# Patient Record
Sex: Male | Born: 1955 | Race: White | Hispanic: No | Marital: Married | Smoking: Never smoker
Health system: Southern US, Community
[De-identification: ages and names within clinical notes are randomized; demographics above are authoritative.]

## PROBLEM LIST (undated history)

## (undated) DIAGNOSIS — M549 Dorsalgia, unspecified: Secondary | ICD-10-CM

## (undated) DIAGNOSIS — I4891 Unspecified atrial fibrillation: Secondary | ICD-10-CM

## (undated) DIAGNOSIS — I10 Essential (primary) hypertension: Secondary | ICD-10-CM

## (undated) DIAGNOSIS — E663 Overweight: Secondary | ICD-10-CM

## (undated) DIAGNOSIS — I480 Paroxysmal atrial fibrillation: Secondary | ICD-10-CM

## (undated) DIAGNOSIS — K219 Gastro-esophageal reflux disease without esophagitis: Secondary | ICD-10-CM

## (undated) DIAGNOSIS — G4733 Obstructive sleep apnea (adult) (pediatric): Secondary | ICD-10-CM

## (undated) HISTORY — DX: Unspecified atrial fibrillation: I48.91

## (undated) HISTORY — DX: Obstructive sleep apnea (adult) (pediatric): G47.33

## (undated) HISTORY — DX: Paroxysmal atrial fibrillation: I48.0

## (undated) HISTORY — DX: Overweight: E66.3

## (undated) HISTORY — PX: OTHER SURGICAL HISTORY: SHX169

## (undated) HISTORY — DX: Dorsalgia, unspecified: M54.9

## (undated) HISTORY — DX: Gastro-esophageal reflux disease without esophagitis: K21.9

## (undated) HISTORY — DX: Essential (primary) hypertension: I10

---

## 2001-06-16 ENCOUNTER — Ambulatory Visit (HOSPITAL_BASED_OUTPATIENT_CLINIC_OR_DEPARTMENT_OTHER): Admission: RE | Admit: 2001-06-16 | Discharge: 2001-06-16 | Payer: Self-pay | Admitting: *Deleted

## 2011-01-21 ENCOUNTER — Ambulatory Visit (HOSPITAL_COMMUNITY)
Admission: RE | Admit: 2011-01-21 | Discharge: 2011-01-21 | Disposition: A | Discharge status: Home / Self Care | Payer: BC Managed Care – PPO | Source: Ambulatory Visit | Attending: Obstetrics and Gynecology | Admitting: Obstetrics and Gynecology

## 2013-11-07 ENCOUNTER — Encounter: Payer: Self-pay | Admitting: *Deleted

## 2013-11-07 ENCOUNTER — Encounter: Payer: Self-pay | Admitting: Interventional Cardiology

## 2013-11-07 DIAGNOSIS — I4891 Unspecified atrial fibrillation: Secondary | ICD-10-CM | POA: Insufficient documentation

## 2013-11-07 DIAGNOSIS — I10 Essential (primary) hypertension: Secondary | ICD-10-CM | POA: Insufficient documentation

## 2013-11-07 DIAGNOSIS — K219 Gastro-esophageal reflux disease without esophagitis: Secondary | ICD-10-CM | POA: Insufficient documentation

## 2013-11-07 DIAGNOSIS — M549 Dorsalgia, unspecified: Secondary | ICD-10-CM | POA: Insufficient documentation

## 2013-11-08 ENCOUNTER — Ambulatory Visit (INDEPENDENT_AMBULATORY_CARE_PROVIDER_SITE_OTHER): Payer: BC Managed Care – PPO | Admitting: Interventional Cardiology

## 2013-11-08 ENCOUNTER — Encounter: Payer: Self-pay | Admitting: Interventional Cardiology

## 2013-11-08 ENCOUNTER — Ambulatory Visit: Payer: BC Managed Care – PPO | Admitting: Interventional Cardiology

## 2013-11-08 VITALS — BP 128/78 | HR 81 | Ht 70.0 in | Wt 207.0 lb

## 2013-11-08 DIAGNOSIS — I10 Essential (primary) hypertension: Secondary | ICD-10-CM

## 2013-11-08 DIAGNOSIS — I480 Paroxysmal atrial fibrillation: Secondary | ICD-10-CM

## 2013-11-08 DIAGNOSIS — I4891 Unspecified atrial fibrillation: Secondary | ICD-10-CM

## 2013-11-08 NOTE — Progress Notes (Signed)
Patient ID: Miguel Stevens, male   DOB: [birth year], 57 y.o.   MRN: [id number]    [address], [address] [place], [place]  [address] Phone: [phone] Fax:  [phone]  Date:  11/08/2013   ID:  Miguel Stevens, DOB Aug 25, 1956, MRN [id number]  PCP:  No primary provider on file.   ASSESSMENT:  1. Paroxysmal atrial fibrillation 2. Hypertension  PLAN:  1. Continue aspirin 81 mg daily 2. If increased palpitations, may increase metoprolol intermittently to 75 mg per day instead of the usual 50 (in other words it would be okay to take an additional 25 mg if he is having particular difficulty with palpitations) 3. One-year followup   SUBJECTIVE: Miguel Stevens is a 57 y.o. male who is doing well and expecting a third child. He has occasional palpitations but no prolonged severe episodes. He still endorses a dramatic improvement in palpitations, reduction in headaches, and decreased episodes of flushing since starting metoprolol. No side effects. Her erectile dysfunction. Overall he is doing quite well. He is transferred with him several times per week.   Wt Readings from Last 3 Encounters:  11/08/13 207 lb (93.895 kg)     Past Medical History  Diagnosis Date  . Atrial fibrillation     Lone atrial fibrillation by Holter July 2009. This echo demonstrates normal left ventricle size and function. LA size 42 mm   . Hypertension   . Back pain   . GERD (gastroesophageal reflux disease)     Current Outpatient Prescriptions  Medication Sig Dispense Refill  . aspirin 81 MG tablet Take 81 mg by mouth daily.      . metoprolol succinate (TOPROL-XL) 50 MG 24 hr tablet Take 1 tab daily      . Multiple Vitamin (MULTIVITAMIN) tablet Take 1 tablet by mouth daily.      . Omega-3 Fatty Acids (FISH OIL) 1000 MG CAPS Take by mouth.      . ranitidine (ZANTAC) 150 MG capsule Take 150 mg by mouth 2 (two) times daily.       No current facility-administered medications for this visit.     Allergies:   No Known Allergies  Social History:  The patient  reports that he has never smoked. He does not have any smokeless tobacco history on file. He reports that he drinks alcohol. He reports that he does not use illicit drugs.   ROS:  Please see the history of present illness.   Denies syncope, transient neurological symptoms, edema, and dyspnea   All other systems reviewed and negative.   OBJECTIVE: VS:  BP 128/78  Pulse 81  Ht 5\' 10"  (1.778 m)  Wt 207 lb (93.895 kg)  BMI 29.70 kg/m2 Well nourished, well developed, in no acute distress, younger than stated age, bearded, young. HEENT: normal Neck: JVD flat. Carotid bruit 2+ upstroke without bruit  Cardiac:  normal S1, S2; RRR; no murmur Lungs:  clear to auscultation bilaterally, no wheezing, rhonchi or rales Abd: soft, nontender, no hepatomegaly Ext: Edema absent. Pulses 2+ Skin: warm and dry Neuro:  CNs 2-12 intact, no focal abnormalities noted  EKG:  Normal       Signed, Darci Needle III, MD 11/08/2013 3:25 PM

## 2013-11-08 NOTE — Patient Instructions (Signed)
Your physician recommends that you continue on your current medications as directed. Please refer to the Current Medication list given to you today.  Ok to take an extra 1/2 tablet (25mg ) if you are experiencing palpitations  Your physician wants you to follow-up in: 1 year You will receive a reminder letter in the mail two months in advance. If you don't receive a letter, please call our office to schedule the follow-up appointment.  Your physician discussed the importance of regular exercise and recommended that you start or continue a regular exercise program for good health.

## 2014-04-08 ENCOUNTER — Other Ambulatory Visit: Payer: Self-pay | Admitting: Interventional Cardiology

## 2014-10-08 ENCOUNTER — Other Ambulatory Visit: Payer: Self-pay | Admitting: Interventional Cardiology

## 2015-01-04 ENCOUNTER — Other Ambulatory Visit: Payer: Self-pay | Admitting: *Deleted

## 2015-01-04 MED ORDER — METOPROLOL SUCCINATE ER 50 MG PO TB24: 50.0000 mg | ORAL_TABLET | Freq: Every day | ORAL | Status: DC

## 2015-02-07 ENCOUNTER — Other Ambulatory Visit: Payer: Self-pay | Admitting: Interventional Cardiology

## 2015-02-08 ENCOUNTER — Other Ambulatory Visit: Payer: Self-pay

## 2015-02-08 MED ORDER — METOPROLOL SUCCINATE ER 50 MG PO TB24: 50.0000 mg | ORAL_TABLET | Freq: Every day | ORAL | Status: DC

## 2015-02-11 ENCOUNTER — Other Ambulatory Visit: Payer: Self-pay | Admitting: *Deleted

## 2015-02-11 MED ORDER — METOPROLOL SUCCINATE ER 50 MG PO TB24: 50.0000 mg | ORAL_TABLET | Freq: Every day | ORAL | Status: DC

## 2015-02-12 ENCOUNTER — Encounter: Payer: Self-pay | Admitting: Interventional Cardiology

## 2015-02-12 ENCOUNTER — Ambulatory Visit (INDEPENDENT_AMBULATORY_CARE_PROVIDER_SITE_OTHER): Payer: BC Managed Care – PPO | Admitting: Interventional Cardiology

## 2015-02-12 VITALS — BP 144/88 | HR 54 | Ht 70.0 in | Wt 213.0 lb

## 2015-02-12 DIAGNOSIS — I10 Essential (primary) hypertension: Secondary | ICD-10-CM

## 2015-02-12 DIAGNOSIS — E785 Hyperlipidemia, unspecified: Secondary | ICD-10-CM

## 2015-02-12 DIAGNOSIS — I480 Paroxysmal atrial fibrillation: Secondary | ICD-10-CM

## 2015-02-12 NOTE — Progress Notes (Signed)
Cardiology Office Note   Date:  02/12/2015   ID:  Miguel Bucklerndrew C Fallin, DOB [birth year], MRN [id number]  PCP:  No primary care provider on file.  Cardiologist:   Lesleigh NoeSMITH III,Annamae Shivley W, MD   No chief complaint on file.     History of Present Illness: Miguel Stevens is a 59 y.o. male who presents for follow-up of palpitations. Also history of brief atrial fibrillation. His chads score is 1. He has had no prolonged palpitations. He denies chest pain. No history of syncope. No medication side effects. Occasional orthostatic dizziness.    Past Medical History  Diagnosis Date  . Atrial fibrillation     Lone atrial fibrillation by Holter July 2009. This echo demonstrates normal left ventricle size and function. LA size 42 mm   . Hypertension   . Back pain   . GERD (gastroesophageal reflux disease)     Past Surgical History  Procedure Laterality Date  . Wisdom teeth excised       Current Outpatient Prescriptions  Medication Sig Dispense Refill  . aspirin 81 MG tablet Take 81 mg by mouth daily.    Marland Kitchen. ibuprofen (ADVIL,MOTRIN) 200 MG tablet Take 200 mg by mouth every 4 (four) hours.    . metoprolol succinate (TOPROL-XL) 50 MG 24 hr tablet Take 1 tablet (50 mg total) by mouth daily. Take with or immediately following a meal. 30 tablet 11  . Multiple Vitamin (MULTIVITAMIN) tablet Take 1 tablet by mouth daily.    . Omega-3 Fatty Acids (FISH OIL) 1000 MG CAPS Take by mouth.    . ranitidine (ZANTAC) 150 MG capsule Take 150 mg by mouth 2 (two) times daily.     No current facility-administered medications for this visit.    Allergies:   Review of patient's allergies indicates no known allergies.    Social History:  The patient  reports that he has never smoked. He does not have any smokeless tobacco history on file. He reports that he drinks alcohol. He reports that he does not use illicit drugs.   Family History:  The patient's family history includes Heart disease in his father and mother.      ROS:  Please see the history of present illness.   Otherwise, review of systems are positive for none.   All other systems are reviewed and negative.    PHYSICAL EXAM: VS:  BP 144/88 mmHg  Pulse 54  Ht 5\' 10"  (1.778 m)  Wt 213 lb (96.616 kg)  BMI 30.56 kg/m2 , BMI Body mass index is 30.56 kg/(m^2). GEN: Well nourished, well developed, in no acute distress HEENT: normal Neck: no JVD, carotid bruits, or masses Cardiac:RRR; no murmurs, rubs, or gallops,no edema  Respiratory:  clear to auscultation bilaterally, normal work of breathing GI: soft, nontender, nondistended, + BS MS: no deformity or atrophy Skin: warm and dry, no rash Neuro:  Strength and sensation are intact Psych: euthymic mood, full affect   EKG:  EKG is ordered today. The ekg ordered today demonstrates sinus bradycardia and otherwise normal   Recent Labs: No results found for requested labs within last 365 days.    Lipid Panel No results found for: CHOL, TRIG, HDL, CHOLHDL, VLDL, LDLCALC, LDLDIRECT    Wt Readings from Last 3 Encounters:  02/12/15 213 lb (96.616 kg)  11/08/13 207 lb (93.895 kg)      Other studies Reviewed: Additional studies/ records that were reviewed today include: .   ASSESSMENT AND PLAN:  1.  Hypertension,  stable. 2. History of atrial fibrillation with no recent recurrences   Current medicines are reviewed at length with the patient today.  The patient does not have concerns regarding medicines.  The following changes have been made:  Refill indications for 90 day supplies  Labs/ tests ordered today include:  No orders of the defined types were placed in this encounter.     Disposition:   FU with Verdis Prime in one Year   Signed, Lesleigh Noe, MD  02/12/2015 8:56 AM    [facility] [address] [place], [place], [place]  [address] Phone: [phone]; Fax: [phone]

## 2015-02-12 NOTE — Patient Instructions (Signed)
Your physician recommends that you continue on your current medications as directed. Please refer to the Current Medication list given to you today.  Your physician wants you to follow-up in: 1 year with Dr.Smith You will receive a reminder letter in the mail two months in advance. If you don't receive a letter, please call our office to schedule the follow-up appointment.  

## 2015-04-09 ENCOUNTER — Other Ambulatory Visit: Payer: Self-pay | Admitting: Interventional Cardiology

## 2015-04-10 ENCOUNTER — Other Ambulatory Visit: Payer: Self-pay | Admitting: Interventional Cardiology

## 2015-12-31 ENCOUNTER — Other Ambulatory Visit: Payer: Self-pay | Admitting: Interventional Cardiology

## 2016-03-12 ENCOUNTER — Ambulatory Visit (INDEPENDENT_AMBULATORY_CARE_PROVIDER_SITE_OTHER): Payer: BC Managed Care – PPO | Admitting: Interventional Cardiology

## 2016-03-12 ENCOUNTER — Encounter: Payer: Self-pay | Admitting: Interventional Cardiology

## 2016-03-12 VITALS — BP 120/86 | HR 51 | Ht 70.0 in | Wt 205.0 lb

## 2016-03-12 DIAGNOSIS — I10 Essential (primary) hypertension: Secondary | ICD-10-CM

## 2016-03-12 DIAGNOSIS — E785 Hyperlipidemia, unspecified: Secondary | ICD-10-CM | POA: Diagnosis not present

## 2016-03-12 DIAGNOSIS — I480 Paroxysmal atrial fibrillation: Secondary | ICD-10-CM

## 2016-03-12 MED ORDER — METOPROLOL SUCCINATE ER 50 MG PO TB24: 50.0000 mg | ORAL_TABLET | Freq: Every day | ORAL | Status: DC

## 2016-03-12 NOTE — Progress Notes (Signed)
Cardiology Office Note   Date:  03/12/2016   ID:  Miguel Stevens, DOB 04-27-1956, MRN [id number]  PCP:  Miguel DossWILLARD,JENNIFER, PA-C  Cardiologist:  Lesleigh NoeSMITH III,Pamalee Marcoe W, MD   Chief Complaint  Patient presents with  . Hypertension  . Palpitations      History of Present Illness: Miguel Stevens is a 60 y.o. male who presents for Follow-up of palpitations and blood pressure.  Miguel Castillandrew is doing well. He is had no prolonged palpitations. Exertional tolerance is improving. He denies chest discomfort or other complaints. He does not feel that there are any medication side effects.    Past Medical History  Diagnosis Date  . Atrial fibrillation (HCC)     Lone atrial fibrillation by Holter July 2009. This echo demonstrates normal left ventricle size and function. LA size 42 mm   . Hypertension   . Back pain   . GERD (gastroesophageal reflux disease)     Past Surgical History  Procedure Laterality Date  . Wisdom teeth excised       Current Outpatient Prescriptions  Medication Sig Dispense Refill  . aspirin 81 MG tablet Take 81 mg by mouth daily.    Marland Kitchen. ibuprofen (ADVIL,MOTRIN) 200 MG tablet Take 200 mg by mouth every 4 (four) hours as needed for fever or moderate pain.     . metoprolol succinate (TOPROL-XL) 50 MG 24 hr tablet Take 1 tablet (50 mg total) by mouth daily. Take with or immediately following a meal. 90 tablet 3  . metroNIDAZOLE (METROGEL) 1 % gel Apply 1 application topically daily.    . Multiple Vitamin (MULTIVITAMIN) tablet Take 1 tablet by mouth daily.    . Omega-3 Fatty Acids (FISH OIL) 1000 MG CAPS Take 1 capsule by mouth daily.     . ranitidine (ZANTAC) 150 MG capsule Take 150 mg by mouth 2 (two) times daily.     No current facility-administered medications for this visit.    Allergies:   Review of patient's allergies indicates no known allergies.    Social History:  The patient  reports that he has never smoked. He has never used smokeless tobacco. He reports that  he drinks alcohol. He reports that he does not use illicit drugs.   Family History:  The patient's family history includes Heart disease in his father and mother.    ROS:  Please see the history of present illness.   Otherwise, review of systems are positive for Facial flushing and now has rosacea acne, occasional wheezing, otherwise no complaints..   All other systems are reviewed and negative.    PHYSICAL EXAM: VS:  BP 120/86 mmHg  Pulse 51  Ht 5\' 10"  (1.778 m)  Wt 205 lb (92.987 kg)  BMI 29.41 kg/m2 , BMI Body mass index is 29.41 kg/(m^2). GEN: Well nourished, well developed, in no acute distress HEENT: normal Neck: no JVD, carotid bruits, or masses Cardiac: RRR.  There is no murmur, rub, or gallop. There is no edema. Respiratory:  clear to auscultation bilaterally, normal work of breathing. GI: soft, nontender, nondistended, + BS MS: no deformity or atrophy Skin: warm and dry, no rash Neuro:  Strength and sensation are intact Psych: euthymic mood, full affect   EKG:  EKG is ordered today. The ekg reveals normal sinus rhythm with normal overall appearance. No change from prior.   Recent Labs: No results found for requested labs within last 365 days.    Lipid Panel No results found for: CHOL, TRIG, HDL, CHOLHDL,  VLDL, LDLCALC, LDLDIRECT    Wt Readings from Last 3 Encounters:  03/12/16 205 lb (92.987 kg)  02/12/15 213 lb (96.616 kg)  11/08/13 207 lb (93.895 kg)      Other studies Reviewed: Additional studies/ records that were reviewed today include: none. The findings include none.    ASSESSMENT AND PLAN:  1. PAF (paroxysmal atrial fibrillation) (HCC) No recurrences - EKG 12-Lead  2. Essential hypertension Excellent control - EKG 12-Lead  3. Hyperlipidemia Followed by primary    Current medicines are reviewed at length with the patient today.  The patient has the following concerns regarding medicines: None.  The following changes/actions have been  instituted:    None  Encouraged aerobic activity  Labs/ tests ordered today include:  Orders Placed This Encounter  Procedures  . EKG 12-Lead     Disposition:   FU with HS in 1 year  Signed, Lesleigh Noe, MD  03/12/2016 10:41 AM    [facility] [address] [place], [place], [place]  [address] Phone: [phone]; Fax: [phone]

## 2016-03-12 NOTE — Patient Instructions (Signed)
Medication Instructions:  Your physician recommends that you continue on your current medications as directed. Please refer to the Current Medication list given to you today.   Labwork: None ordered  Testing/Procedures: None ordered  Follow-Up: Your physician wants you to follow-up in: 1 year with Dr.Smith You will receive a reminder letter in the mail two months in advance. If you don't receive a letter, please call our office to schedule the follow-up appointment.   Any Other Special Instructions Will Be Listed Below (If Applicable). Your physician discussed the importance of regular exercise and recommended that you start or continue a regular exercise program for good health.       If you need a refill on your cardiac medications before your next appointment, please call your pharmacy.   

## 2016-03-26 ENCOUNTER — Other Ambulatory Visit: Payer: Self-pay | Admitting: Interventional Cardiology

## 2016-09-08 ENCOUNTER — Encounter: Payer: Self-pay | Admitting: Interventional Cardiology

## 2017-03-28 ENCOUNTER — Other Ambulatory Visit: Payer: Self-pay | Admitting: Interventional Cardiology

## 2018-03-20 ENCOUNTER — Other Ambulatory Visit: Payer: Self-pay | Admitting: Interventional Cardiology

## 2018-03-21 NOTE — Telephone Encounter (Signed)
Ok to give short term supply with note to make appt for further refills.  Thanks!

## 2018-04-10 ENCOUNTER — Telehealth: Payer: Self-pay | Admitting: Physician Assistant

## 2018-04-10 DIAGNOSIS — I10 Essential (primary) hypertension: Secondary | ICD-10-CM

## 2018-04-10 MED ORDER — METOPROLOL SUCCINATE ER 50 MG PO TB24: 50.0000 mg | ORAL_TABLET | Freq: Every day | ORAL | 3 refills | Status: DC

## 2018-04-10 NOTE — Telephone Encounter (Signed)
Pt called stating he forgot his torpol and is out of town. New prescription sent electronically to [facility] [place], [place], at patient's request. He will call back in there is a problem.

## 2018-04-15 ENCOUNTER — Other Ambulatory Visit: Payer: Self-pay | Admitting: Interventional Cardiology

## 2018-04-25 ENCOUNTER — Other Ambulatory Visit: Payer: Self-pay | Admitting: Interventional Cardiology

## 2018-07-05 ENCOUNTER — Encounter: Payer: Self-pay | Admitting: Interventional Cardiology

## 2018-07-28 ENCOUNTER — Other Ambulatory Visit: Payer: Self-pay | Admitting: *Deleted

## 2018-07-28 ENCOUNTER — Telehealth: Payer: Self-pay | Admitting: Interventional Cardiology

## 2018-07-28 MED ORDER — METOPROLOL SUCCINATE ER 50 MG PO TB24: ORAL_TABLET | ORAL | 0 refills | Status: DC

## 2018-07-28 NOTE — Telephone Encounter (Signed)
New Message:        *STAT* If patient is at the pharmacy, call can be transferred to refill team.   1. Which medications need to be refilled? (please list name of each medication and dose if known)metoprolol succinate (TOPROL-XL) 50 MG 24 hr tablet   2. Which pharmacy/location (including street and city if local pharmacy) is medication to be sent to?[phone] [place], [place] - [address]  3. Do they need a 30 day or 90 day supply? 30

## 2018-08-04 ENCOUNTER — Encounter: Payer: Self-pay | Admitting: Interventional Cardiology

## 2018-08-21 ENCOUNTER — Other Ambulatory Visit: Payer: Self-pay | Admitting: Interventional Cardiology

## 2018-08-24 ENCOUNTER — Other Ambulatory Visit: Payer: Self-pay | Admitting: Interventional Cardiology

## 2018-08-25 ENCOUNTER — Other Ambulatory Visit: Payer: Self-pay | Admitting: Interventional Cardiology

## 2018-08-29 ENCOUNTER — Ambulatory Visit: Payer: BC Managed Care – PPO | Admitting: Interventional Cardiology

## 2018-08-29 ENCOUNTER — Encounter: Payer: Self-pay | Admitting: Interventional Cardiology

## 2018-08-29 VITALS — BP 118/74 | HR 67 | Ht 70.0 in | Wt 214.0 lb

## 2018-08-29 DIAGNOSIS — R002 Palpitations: Secondary | ICD-10-CM

## 2018-08-29 DIAGNOSIS — I10 Essential (primary) hypertension: Secondary | ICD-10-CM

## 2018-08-29 MED ORDER — METOPROLOL SUCCINATE ER 50 MG PO TB24: 50.0000 mg | ORAL_TABLET | Freq: Every day | ORAL | 3 refills | Status: DC

## 2018-08-29 NOTE — Progress Notes (Signed)
Cardiology Office Note:    Date:  08/29/2018   ID:  Miguel Stevens, DOB 18-Jul-1956, MRN [id number]  PCP:  Shirlean Mylar, MD  Cardiologist:  No primary care provider on file.   Referring MD: Carilyn Goodpasture, PA-C   Chief Complaint  Patient presents with  . Hypertension    History of Present Illness:    Miguel Stevens is a 62 y.o. male with a hx of palpitations and blood pressure.  He is doing well.  He denies angina.  He denies dyspnea.  Feels somewhat tired.  He snores some at night.  No medication side effects.  Not exercising.  Past Medical History:  Diagnosis Date  . Atrial fibrillation (HCC)    Lone atrial fibrillation by Holter July 2009. This echo demonstrates normal left ventricle size and function. LA size 42 mm   . Back pain   . GERD (gastroesophageal reflux disease)   . Hypertension     Past Surgical History:  Procedure Laterality Date  . wisdom teeth excised      Current Medications: Current Meds  Medication Sig  . aspirin 81 MG tablet Take 81 mg by mouth daily.  Marland Kitchen ibuprofen (ADVIL,MOTRIN) 200 MG tablet Take 200 mg by mouth every 4 (four) hours as needed for fever or moderate pain.   . metoprolol succinate (TOPROL-XL) 50 MG 24 hr tablet Take 1 tablet (50 mg total) by mouth daily.  . metroNIDAZOLE (METROGEL) 1 % gel Apply 1 application topically daily.  . Multiple Vitamin (MULTIVITAMIN) tablet Take 1 tablet by mouth daily.  . Omega-3 Fatty Acids (FISH OIL) 1000 MG CAPS Take 1 capsule by mouth daily.   . ranitidine (ZANTAC) 150 MG capsule Take 150 mg by mouth 2 (two) times daily.  . [DISCONTINUED] metoprolol succinate (TOPROL-XL) 50 MG 24 hr tablet Take 1 tablet (50 mg total) by mouth daily. Take with or immediately following a meal. Please keep 9/9 appointment for additional refills.     Allergies:   Patient has no known allergies.   Social History   Socioeconomic History  . Marital status: Married    Spouse name: Not on file  . Number of children: Not  on file  . Years of education: Not on file  . Highest education level: Not on file  Occupational History  . Not on file  Social Needs  . Financial resource strain: Not on file  . Food insecurity:    Worry: Not on file    Inability: Not on file  . Transportation needs:    Medical: Not on file    Non-medical: Not on file  Tobacco Use  . Smoking status: Never Smoker  . Smokeless tobacco: Never Used  Substance and Sexual Activity  . Alcohol use: Yes    Alcohol/week: 0.0 standard drinks    Comment: an occasionally beer .  Marland Kitchen Drug use: No  . Sexual activity: Not on file  Lifestyle  . Physical activity:    Days per week: Not on file    Minutes per session: Not on file  . Stress: Not on file  Relationships  . Social connections:    Talks on phone: Not on file    Gets together: Not on file    Attends religious service: Not on file    Active member of club or organization: Not on file    Attends meetings of clubs or organizations: Not on file    Relationship status: Not on file  Other Topics Concern  .  Not on file  Social History Narrative  . Not on file     Family History: The patient's family history includes Heart disease in his father and mother.  ROS:   Please see the history of present illness.    Snores all other systems reviewed and are negative.  EKGs/Labs/Other Studies Reviewed:    The following studies were reviewed today: None  EKG:  EKG is  ordered today.  The ekg ordered today demonstrates normal sinus rhythm with nonspecific ST-T abnormality.  Recent Labs: No results found for requested labs within last 8760 hours.  Recent Lipid Panel No results found for: CHOL, TRIG, HDL, CHOLHDL, VLDL, LDLCALC, LDLDIRECT  Physical Exam:    VS:  BP 118/74   Pulse 67   Ht 5\' 10"  (1.778 m)   Wt 214 lb (97.1 kg)   BMI 30.71 kg/m     Wt Readings from Last 3 Encounters:  08/29/18 214 lb (97.1 kg)  03/12/16 205 lb (93 kg)  02/12/15 213 lb (96.6 kg)     GEN:   Well nourished, well developed in no acute distress HEENT: Normal NECK: No JVD. LYMPHATICS: No lymphadenopathy CARDIAC: RRR, no murmur, no gallop, no edema. VASCULAR: 2+ and symmetric radial and posterior tibial pulses.  No bruits. RESPIRATORY:  Clear to auscultation without rales, wheezing or rhonchi  ABDOMEN: Soft, non-tender, non-distended, No pulsatile mass, MUSCULOSKELETAL: No deformity  SKIN: Warm and dry NEUROLOGIC:  Alert and oriented x 3 PSYCHIATRIC:  Normal affect   ASSESSMENT:    1. Essential hypertension   2. Palpitations    PLAN:    In order of problems listed above:  1. Very well controlled.  Target 130/80 mmHg. 2. No complaints  Overall primary prevention discussed and included recommendation of 150 minutes of moderate physical activity per week.  We also discussed weight loss using a plant-based diet.   Medication Adjustments/Labs and Tests Ordered: Current medicines are reviewed at length with the patient today.  Concerns regarding medicines are outlined above.  Orders Placed This Encounter  Procedures  . EKG 12-Lead   Meds ordered this encounter  Medications  . metoprolol succinate (TOPROL-XL) 50 MG 24 hr tablet    Sig: Take 1 tablet (50 mg total) by mouth daily.    Dispense:  90 tablet    Refill:  3    Patient Instructions  Medication Instructions:  Your physician recommends that you continue on your current medications as directed. Please refer to the Current Medication list given to you today.   Labwork: none  Testing/Procedures: none  Follow-Up: Your physician wants you to follow-up in: 12 months.  You will receive a reminder letter in the mail two months in advance. If you don't receive a letter, please call our office to schedule the follow-up appointment.   Any Other Special Instructions Will Be Listed Below (If Applicable).   Your physician discussed the importance of regular exercise and recommended that you start or continue a  regular exercise program for good health.    If you need a refill on your cardiac medications before your next appointment, please call your pharmacy.      Signed, Lesleigh Noe, MD  08/29/2018 6:03 PM    [facility]

## 2018-08-29 NOTE — Patient Instructions (Signed)
Medication Instructions:  Your physician recommends that you continue on your current medications as directed. Please refer to the Current Medication list given to you today.   Labwork: none  Testing/Procedures: none  Follow-Up: Your physician wants you to follow-up in: 12 months.  You will receive a reminder letter in the mail two months in advance. If you don't receive a letter, please call our office to schedule the follow-up appointment.   Any Other Special Instructions Will Be Listed Below (If Applicable).   Your physician discussed the importance of regular exercise and recommended that you start or continue a regular exercise program for good health.    If you need a refill on your cardiac medications before your next appointment, please call your pharmacy.

## 2019-09-19 ENCOUNTER — Other Ambulatory Visit: Payer: Self-pay | Admitting: Interventional Cardiology

## 2019-09-19 NOTE — Telephone Encounter (Signed)
°*  STAT* If patient is at the pharmacy, call can be transferred to refill team.   1. Which medications need to be refilled? (please list name of each medication and dose if known)  metoprolol succinate (TOPROL-XL) 50 MG 24 hr tablet  2. Which pharmacy/location (including street and city if local pharmacy) is medication to be sent to?  [facility] [place] [place] [facility] [address]  3. Do they need a 30 day or 90 day supply?90   Patient  has an appointment scheduled with Dr. Tamala Julian on 11-27-19, but only has 3 pills left

## 2019-09-20 MED ORDER — METOPROLOL SUCCINATE ER 50 MG PO TB24: 50.0000 mg | ORAL_TABLET | Freq: Every day | ORAL | 0 refills | Status: DC

## 2019-09-20 NOTE — Telephone Encounter (Signed)
Pt's medication was sent to pt's pharmacy as requested. Confirmation received.  °

## 2019-11-26 NOTE — Progress Notes (Addendum)
Cardiology Office Note:    Date:  11/27/2019   ID:  MURRY DIAZ, DOB April 18, 1956, MRN [id number]  PCP:  Shirlean Mylar, MD  Cardiologist:  Lesleigh Noe, MD   Referring MD: Shirlean Mylar, MD   Chief Complaint  Patient presents with  . Atrial Fibrillation  . Hypertension  . Hyperlipidemia    History of Present Illness:    Miguel Stevens is a 63 y.o. male with a hx of palpitation ( PAF >20 yrs ago) on aspirin with CV risk score 1, and high blood pressure.  Miguel Stevens is here today and is asymptomatic.  He was shocked when I told him that he is in atrial fibrillation.  He had one episode of atrial fibrillation greater than 20 years ago when I first saw him when he was in his late 87s.  At that time he will complain of palpitations.  Monitoring did not reveal any recurrence of atrial fibrillation.  We subsequently identified elevated blood pressure which has been managed with metoprolol for greater than 10 years.  In the very beginning, greater than 20 years ago in addition to palpitations he complained of flushing, headache, and irregular heartbeat.  The symptoms have not been present.  He snores based upon conversations with his wife.  He does not have excessive daytime sleepiness.  He denies chest pain.  His father had coronary bypass surgery at age 5.  The most recent LDL was 97 in 2019.  Past Medical History:  Diagnosis Date  . Atrial fibrillation (HCC)    Lone atrial fibrillation by Holter July 2009. This echo demonstrates normal left ventricle size and function. LA size 42 mm   . Back pain   . GERD (gastroesophageal reflux disease)   . Hypertension     Past Surgical History:  Procedure Laterality Date  . wisdom teeth excised      Current Medications: Current Meds  Medication Sig  . aspirin 81 MG tablet Take 81 mg by mouth daily.  Marland Kitchen FAMOTIDINE PO Take 1 tablet by mouth daily.  Marland Kitchen ibuprofen (ADVIL,MOTRIN) 200 MG tablet Take 200 mg by mouth every 4 (four) hours as  needed for fever or moderate pain.   . metoprolol succinate (TOPROL-XL) 50 MG 24 hr tablet Take 1 tablet (50 mg total) by mouth daily. Take with or immediately following a meal. Please keep upcoming appt in December with Dr. Katrinka Blazing. Thank you  . metroNIDAZOLE (METROGEL) 1 % gel Apply 1 application topically daily.  . Multiple Vitamin (MULTIVITAMIN) tablet Take 1 tablet by mouth daily.     Allergies:   Patient has no known allergies.   Social History   Socioeconomic History  . Marital status: Married    Spouse name: Not on file  . Number of children: Not on file  . Years of education: Not on file  . Highest education level: Not on file  Occupational History  . Not on file  Social Needs  . Financial resource strain: Not on file  . Food insecurity    Worry: Not on file    Inability: Not on file  . Transportation needs    Medical: Not on file    Non-medical: Not on file  Tobacco Use  . Smoking status: Never Smoker  . Smokeless tobacco: Never Used  Substance and Sexual Activity  . Alcohol use: Yes    Alcohol/week: 0.0 standard drinks    Comment: an occasionally beer .  Marland Kitchen Drug use: No  . Sexual activity:  Not on file  Lifestyle  . Physical activity    Days per week: Not on file    Minutes per session: Not on file  . Stress: Not on file  Relationships  . Social Musicianconnections    Talks on phone: Not on file    Gets together: Not on file    Attends religious service: Not on file    Active member of club or organization: Not on file    Attends meetings of clubs or organizations: Not on file    Relationship status: Not on file  Other Topics Concern  . Not on file  Social History Narrative  . Not on file     Family History: The patient's family history includes Heart disease in his father and mother.  ROS:   Please see the history of present illness.    Feels he is deconditioned.  He has gained weight related to COVID-19 which is restricted freedom to exercise and be in his  normal life pattern.  All other systems reviewed and are negative.  EKGs/Labs/Other Studies Reviewed:    The following studies were reviewed today: No imaging data exists within our records.  EKG:  EKG atrial fibrillation, controlled ventricular rate at 81 bpm, nonspecific T wave flattening, poor R wave progression V1 through V4.  When compared to prior tracings, atrial fibrillation is new for the first time in over 10 years.  Recent Labs: No results found for requested labs within last 8760 hours.  Recent Lipid Panel No results found for: CHOL, TRIG, HDL, CHOLHDL, VLDL, LDLCALC, LDLDIRECT  Physical Exam:    VS:  BP 118/82   Pulse 81   Ht 5\' 10"  (1.778 m)   Wt 218 lb (98.9 kg)   SpO2 97%   BMI 31.28 kg/m     Wt Readings from Last 3 Encounters:  11/27/19 218 lb (98.9 kg)  08/29/18 214 lb (97.1 kg)  03/12/16 205 lb (93 kg)     GEN: Mild obesity. No acute distress HEENT: Normal NECK: No JVD. LYMPHATICS: No lymphadenopathy CARDIAC: Irregularly irregular RR without murmur, gallop, or edema. VASCULAR:  Normal Pulses. No bruits. RESPIRATORY:  Clear to auscultation without rales, wheezing or rhonchi  ABDOMEN: Soft, non-tender, non-distended, No pulsatile mass, MUSCULOSKELETAL: No deformity  SKIN: Warm and dry NEUROLOGIC:  Alert and oriented x 3 PSYCHIATRIC:  Normal affect   ASSESSMENT:    1. Atrial fibrillation, unspecified type (HCC)   2. Essential hypertension   3. Other hyperlipidemia   4. Snoring   5. Educated about COVID-19 virus infection    PLAN:    In order of problems listed above:  1. Asymptomatic atrial fibrillation identified on today's routine EKG.  Remote history of PAF greater than 15 years ago.  Current duration is unknown.  Work-up needs to be performed.  Need to determine an accurate Chads Vasc score which is currently 1 (hypertension).  We will do a coronary calcium score, 2D Doppler echocardiogram, and a sleep study is appropriate as this may be a  significant risk factor.  We will also perform a 7-day monitor to determine if PAF is still the diagnosis or if he is in persistent A. fib.  He is asymptomatic.  There is good rate control with metoprolol succinate 50 mg daily. 2. Adequate blood pressure control on current medical regimen with metoprolol. 3. Target LDL with vascular disease history should be 70 or less.  The calcium score will be helpful. 4. Sleep study will be performed 5.  The 3W's is being practiced to avoid COVID-19 infection.  The natural history of atrial fibrillation was discussed.  The inability to cure and the possibility of recurrences was clearly stated.  Management strategies including rhythm control (antiarrhythmic therapy), rate control (beta-blocker therapy or AV node blocking calcium channel blocker therapy), and ablation and/or pacemaker therapy were reviewed.  Stroke risk (as determined by CHADS VASC score >1) was discussed relative to the patient's individual profile.  The expected duration/permanence of anti-coagulation therapy was determined based on the individual risk score.  Coumadin versus NOAC therapy was reviewed, highlighting the lower bleeding risk and improved safety with NOAC therapy.    1 month follow-up  Greater than 50% of the time during this office visit was spent in education, counseling, and coordination of care related to underlying disease process and testing as outlined.    Medication Adjustments/Labs and Tests Ordered: Current medicines are reviewed at length with the patient today.  Concerns regarding medicines are outlined above.  Orders Placed This Encounter  Procedures  . CT CARDIAC SCORING  . LONG TERM MONITOR (3-14 DAYS)  . EKG 12-Lead  . ECHOCARDIOGRAM COMPLETE  . Split night study   No orders of the defined types were placed in this encounter.   Patient Instructions  Medication Instructions:  Your physician recommends that you continue on your current medications as  directed. Please refer to the Current Medication list given to you today.  *If you need a refill on your cardiac medications before your next appointment, please call your pharmacy*  Lab Work: None If you have labs (blood work) drawn today and your tests are completely normal, you will receive your results only by: Marland Kitchen MyChart Message (if you have MyChart) OR . A paper copy in the mail If you have any lab test that is abnormal or we need to change your treatment, we will call you to review the results.  Testing/Procedures: Your physician has requested that you have an echocardiogram. Echocardiography is a painless test that uses sound waves to create images of your heart. It provides your doctor with information about the size and shape of your heart and how well your heart's chambers and valves are working. This procedure takes approximately one hour. There are no restrictions for this procedure.  Your physician recommends that you wear a monitor for 7 days.  Your physician has recommended that you have a sleep study. This test records several body functions during sleep, including: brain activity, eye movement, oxygen and carbon dioxide blood levels, heart rate and rhythm, breathing rate and rhythm, the flow of air through your mouth and nose, snoring, body muscle movements, and chest and belly movement.  Your physician recommends that you have a Calcium Score performed.  Follow-Up: At [facility], you and your health needs are our priority.  As part of our continuing mission to provide you with exceptional heart care, we have created designated Provider Care Teams.  These Care Teams include your primary Cardiologist (physician) and Advanced Practice Providers (APPs -  Physician Assistants and Nurse Practitioners) who all work together to provide you with the care you need, when you need it.  Your physician recommends that you wear a 7 day monitor.    Your next appointment:   1 month(s)  (can have 1/14 at 10:40A)  The format for your next appointment:   In Person  Provider:   You may see Lesleigh Noe, MD or one of the following Advanced Practice Providers on  your designated Care Team:    Truitt Merle, NP  Cecilie Kicks, NP  Kathyrn Drown, NP   Other Instructions      Signed, Sinclair Grooms, MD  11/27/2019 10:45 AM    [place]

## 2019-11-27 ENCOUNTER — Telehealth: Payer: Self-pay

## 2019-11-27 ENCOUNTER — Encounter: Payer: Self-pay | Admitting: Interventional Cardiology

## 2019-11-27 ENCOUNTER — Other Ambulatory Visit: Payer: Self-pay

## 2019-11-27 ENCOUNTER — Ambulatory Visit: Payer: BC Managed Care – PPO | Admitting: Interventional Cardiology

## 2019-11-27 VITALS — BP 118/82 | HR 81 | Ht 70.0 in | Wt 218.0 lb

## 2019-11-27 DIAGNOSIS — R0683 Snoring: Secondary | ICD-10-CM

## 2019-11-27 DIAGNOSIS — I10 Essential (primary) hypertension: Secondary | ICD-10-CM | POA: Diagnosis not present

## 2019-11-27 DIAGNOSIS — I4891 Unspecified atrial fibrillation: Secondary | ICD-10-CM | POA: Diagnosis not present

## 2019-11-27 DIAGNOSIS — Z7189 Other specified counseling: Secondary | ICD-10-CM

## 2019-11-27 DIAGNOSIS — E7849 Other hyperlipidemia: Secondary | ICD-10-CM | POA: Diagnosis not present

## 2019-11-27 NOTE — Telephone Encounter (Signed)
Went over instructions with pt during o/v with Dr Tamala Julian..  7 day ZIO XT ordered and mailed to pt.

## 2019-11-27 NOTE — Patient Instructions (Addendum)
Medication Instructions:  Your physician recommends that you continue on your current medications as directed. Please refer to the Current Medication list given to you today.  *If you need a refill on your cardiac medications before your next appointment, please call your pharmacy*  Lab Work: None If you have labs (blood work) drawn today and your tests are completely normal, you will receive your results only by: Marland Kitchen MyChart Message (if you have MyChart) OR . A paper copy in the mail If you have any lab test that is abnormal or we need to change your treatment, we will call you to review the results.  Testing/Procedures: Your physician has requested that you have an echocardiogram. Echocardiography is a painless test that uses sound waves to create images of your heart. It provides your doctor with information about the size and shape of your heart and how well your heart's chambers and valves are working. This procedure takes approximately one hour. There are no restrictions for this procedure.  Your physician recommends that you wear a monitor for 7 days.  Your physician has recommended that you have a sleep study. This test records several body functions during sleep, including: brain activity, eye movement, oxygen and carbon dioxide blood levels, heart rate and rhythm, breathing rate and rhythm, the flow of air through your mouth and nose, snoring, body muscle movements, and chest and belly movement.  Your physician recommends that you have a Calcium Score performed.  Follow-Up: At [facility], you and your health needs are our priority.  As part of our continuing mission to provide you with exceptional heart care, we have created designated Provider Care Teams.  These Care Teams include your primary Cardiologist (physician) and Advanced Practice Providers (APPs -  Physician Assistants and Nurse Practitioners) who all work together to provide you with the care you need, when you need  it.  Your physician recommends that you wear a 7 day monitor.    Your next appointment:   1 month(s) (can have 1/14 at 10:40A)  The format for your next appointment:   In Person  Provider:   You may see Sinclair Grooms, MD or one of the following Advanced Practice Providers on your designated Care Team:    Truitt Merle, NP  Cecilie Kicks, NP  Kathyrn Drown, NP   Other Instructions

## 2019-11-29 ENCOUNTER — Telehealth: Payer: Self-pay | Admitting: Interventional Cardiology

## 2019-11-29 NOTE — Telephone Encounter (Signed)
Patient is calling with a question regarding wearing the long term monitor. Please Advise.

## 2019-11-29 NOTE — Telephone Encounter (Signed)
Mr. Hollett questions were answered in mychart.  We did review not to shower for first 24 hours after applying monitor and try not to perform any activities which may cause you to excessive perspiration.

## 2019-12-07 ENCOUNTER — Other Ambulatory Visit: Payer: Self-pay

## 2019-12-07 ENCOUNTER — Ambulatory Visit (INDEPENDENT_AMBULATORY_CARE_PROVIDER_SITE_OTHER)
Admission: RE | Admit: 2019-12-07 | Discharge: 2019-12-07 | Disposition: A | Discharge status: Home / Self Care | Payer: Self-pay | Source: Ambulatory Visit | Attending: Interventional Cardiology | Admitting: Interventional Cardiology

## 2019-12-07 ENCOUNTER — Ambulatory Visit (HOSPITAL_COMMUNITY): Payer: BC Managed Care – PPO | Attending: Cardiology

## 2019-12-07 DIAGNOSIS — I4891 Unspecified atrial fibrillation: Secondary | ICD-10-CM | POA: Diagnosis present

## 2019-12-07 DIAGNOSIS — I10 Essential (primary) hypertension: Secondary | ICD-10-CM

## 2019-12-12 ENCOUNTER — Telehealth: Payer: Self-pay | Admitting: *Deleted

## 2019-12-12 NOTE — Telephone Encounter (Addendum)
-----   Message from Loren Racer, RN sent at 12/12/2019 12:45 PM EST ----- Sleep study ordered on 12/7.  Just wanted to check on the status of this.  Your physician has recommended that you have a sleep study.

## 2019-12-13 ENCOUNTER — Telehealth: Payer: Self-pay | Admitting: *Deleted

## 2019-12-13 NOTE — Telephone Encounter (Signed)
Staff message sent to Miguel Stevens per Tillie Rung C @ 9:16 am no PA is required for sleep study. Plan does not participate with [employer]IM.

## 2019-12-18 ENCOUNTER — Other Ambulatory Visit: Payer: Self-pay | Admitting: Interventional Cardiology

## 2019-12-21 ENCOUNTER — Telehealth: Payer: Self-pay | Admitting: *Deleted

## 2019-12-21 NOTE — Telephone Encounter (Signed)
-----   Message from Lauralee Evener, [place] sent at 12/13/2019  9:18 AM EST ----- Regarding: RE: precert I never received this one but I just done it. Per Shelly Flatten With [employer] no PA is required. Plan does not participate with [employer]IM. Ok to schedule. ----- Message ----- From: Freada Bergeron, CMA Sent: 12/12/2019   5:34 PM EST To: [facility] Sleep Studies Subject: precert                                        Split night ----- Message ----- From: Loren Racer, RN Sent: 12/12/2019  12:45 PM EST To: Freada Bergeron, CMA  Sleep study ordered on 12/7.  Just wanted to check on the status of this.  Thanks!

## 2019-12-21 NOTE — Telephone Encounter (Signed)
Patient is scheduled for lab study on 01/07/20. Pt  is scheduled for COVID screening on 01/04/20 2:15.  Patient understands his sleep study will be done at [facility] sleep lab. Patient understands he will receive a sleep packet in a week or so. Patient understands to call if he does not receive the sleep packet in a timely manner. Patient agrees with treatment and thanked me for call.

## 2020-01-04 ENCOUNTER — Other Ambulatory Visit (HOSPITAL_COMMUNITY)
Admission: RE | Admit: 2020-01-04 | Discharge: 2020-01-04 | Disposition: A | Discharge status: Home / Self Care | Payer: BC Managed Care – PPO | Source: Ambulatory Visit | Attending: Cardiology | Admitting: Cardiology

## 2020-01-04 ENCOUNTER — Ambulatory Visit: Payer: BC Managed Care – PPO | Admitting: Interventional Cardiology

## 2020-01-04 DIAGNOSIS — Z20822 Contact with and (suspected) exposure to covid-19: Secondary | ICD-10-CM | POA: Insufficient documentation

## 2020-01-04 DIAGNOSIS — Z01812 Encounter for preprocedural laboratory examination: Secondary | ICD-10-CM | POA: Insufficient documentation

## 2020-01-04 LAB — SARS CORONAVIRUS 2 (TAT 6-24 HRS): SARS Coronavirus 2: NEGATIVE

## 2020-01-07 ENCOUNTER — Other Ambulatory Visit: Payer: Self-pay

## 2020-01-07 ENCOUNTER — Ambulatory Visit (HOSPITAL_BASED_OUTPATIENT_CLINIC_OR_DEPARTMENT_OTHER): Payer: BC Managed Care – PPO | Attending: Interventional Cardiology | Admitting: Cardiology

## 2020-01-07 DIAGNOSIS — I4891 Unspecified atrial fibrillation: Secondary | ICD-10-CM | POA: Diagnosis not present

## 2020-01-07 DIAGNOSIS — G4733 Obstructive sleep apnea (adult) (pediatric): Secondary | ICD-10-CM | POA: Diagnosis not present

## 2020-01-07 DIAGNOSIS — I493 Ventricular premature depolarization: Secondary | ICD-10-CM | POA: Insufficient documentation

## 2020-01-07 DIAGNOSIS — R0902 Hypoxemia: Secondary | ICD-10-CM | POA: Insufficient documentation

## 2020-01-07 DIAGNOSIS — R0683 Snoring: Secondary | ICD-10-CM | POA: Diagnosis not present

## 2020-01-09 ENCOUNTER — Encounter: Payer: Self-pay | Admitting: *Deleted

## 2020-01-09 ENCOUNTER — Telehealth: Payer: Self-pay | Admitting: *Deleted

## 2020-01-09 DIAGNOSIS — G4733 Obstructive sleep apnea (adult) (pediatric): Secondary | ICD-10-CM

## 2020-01-09 NOTE — Telephone Encounter (Signed)

## 2020-01-09 NOTE — Telephone Encounter (Signed)
Informed patient of sleep study results and patient understanding was verbalized. Patient understands his sleep study showed they have sleep apnea and recommend CPAP titration. Please set up titration in the sleep lab.  Patient is aware and agreeable to his test results but fwould like and appointment with the doctor to discuss other options.

## 2020-01-09 NOTE — Procedures (Signed)
     Patient Name: Miguel Stevens, Miguel Stevens Date: 01/07/2020 Gender: Male D.O.B: [birth year] Age (years): 63 Referring Provider: Verdis Prime Height (inches): 70 Interpreting Physician: Armanda Magic MD, ABSM Weight (lbs): 210 RPSGT: Rosette Reveal BMI: 30 MRN: [id number] Neck Size: 18.00  CLINICAL INFORMATION Sleep Study Type: NPSG  Indication for sleep study: N/A  Epworth Sleepiness Score: 3  SLEEP STUDY TECHNIQUE As per the AASM Manual for the Scoring of Sleep and Associated Events v2.3 (April 2016) with a hypopnea requiring 4% desaturations.  The channels recorded and monitored were frontal, central and occipital EEG, electrooculogram (EOG), submentalis EMG (chin), nasal and oral airflow, thoracic and abdominal wall motion, anterior tibialis EMG, snore microphone, electrocardiogram, and pulse oximetry.  MEDICATIONS Medications self-administered by patient taken the night of the study : N/A  SLEEP ARCHITECTURE The study was initiated at 10:57:34 PM and ended at 4:59:27 AM.  Sleep onset time was 50.3 minutes and the sleep efficiency was 68.4%%. The total sleep time was 247.5 minutes.  Stage REM latency was 51.5 minutes.  The patient spent 12.5% of the night in stage N1 sleep, 69.1% in stage N2 sleep, 0.0% in stage N3 and 18.4% in REM.  Alpha intrusion was absent.  Supine sleep was 12.73%.  RESPIRATORY PARAMETERS The overall apnea/hypopnea index (AHI) was 16.5 per hour. There were 44 total apneas, including 40 obstructive, 4 central and 0 mixed apneas. There were 24 hypopneas and 17 RERAs.  The AHI during Stage REM sleep was 25.1 per hour.  AHI while supine was 62.9 per hour.  The mean oxygen saturation was 94.6%. The minimum SpO2 during sleep was 81.0%.  moderate snoring was noted during this study.  CARDIAC DATA The 2 lead EKG demonstrated atrial fibrillation. The mean heart rate was 76.5 beats per minute. Other EKG findings include: PVCs  LEG MOVEMENT DATA The  total PLMS were 0 with a resulting PLMS index of 0.0. Associated arousal with leg movement index was 0.0 .  IMPRESSIONS - Mild obstructive sleep apnea occurred during this study (AHI = 16.5/h). - No significant central sleep apnea occurred during this study (CAI = 1.0/h). - Mild oxygen desaturation was noted during this study (Min O2 = 81.0%). - The patient snored with moderate snoring volume. - EKG findings include PVCs, Atrial Fibrillation - Clinically significant periodic limb movements did not occur during sleep. No significant associated arousals.  DIAGNOSIS - Obstructive Sleep Apnea (327.23 [G47.33 ICD-10]) - Nocturnal Hypoxemia (327.26 [G47.36 ICD-10]Atrial Fibrillation  RECOMMENDATIONS - Therapeutic CPAP titration to determine optimal pressure required to alleviate sleep disordered breathing. - Positional therapy avoiding supine position during sleep. - Avoid alcohol, sedatives and other CNS depressants that may worsen sleep apnea and disrupt normal sleep architecture. - Sleep hygiene should be reviewed to assess factors that may improve sleep quality. - Weight management and regular exercise should be initiated or continued if appropriate.  [Electronically signed] 01/09/2020 10:24 AM  Armanda Magic MD, ABSM Diplomate, American Board of Sleep Medicine

## 2020-01-09 NOTE — Telephone Encounter (Signed)
Will need to set up virtual office visit to discuss sleep study

## 2020-01-09 NOTE — Telephone Encounter (Signed)
-----   Message from Quintella Reichert, MD sent at 01/09/2020 10:26 AM EST ----- Please let patient know that they have sleep apnea and recommend CPAP titration. Please set up titration in the sleep lab.

## 2020-01-28 NOTE — Progress Notes (Signed)
Virtual Visit via Telephone Note   This visit type was conducted due to national recommendations for restrictions regarding the COVID-19 Pandemic (e.g. social distancing) in an effort to limit this patient's exposure and mitigate transmission in our community.  Due to his co-morbid illnesses, this patient is at least at moderate risk for complications without adequate follow up.  This format is felt to be most appropriate for this patient at this time.  All issues noted in this document were discussed and addressed.  A limited physical exam was performed with this format.  Please refer to the patient's chart for his consent to telehealth for [facility].   Evaluation Performed:  Follow-up visit  This visit type was conducted due to national recommendations for restrictions regarding the COVID-19 Pandemic (e.g. social distancing).  This format is felt to be most appropriate for this patient at this time.  All issues noted in this document were discussed and addressed.  No physical exam was performed (except for noted visual exam findings with Video Visits).  Please refer to the patient's chart (MyChart message for video visits and phone note for telephone visits) for the patient's consent to telehealth for [facility].  Date:  01/29/2020   ID:  Miguel Stevens, DOB 1956/03/21, MRN [id number]  Patient Location:  Home  Provider location:   [place]  PCP:  Sueanne Margarita, MD  Cardiologist:  Sinclair Grooms, MD  Sleep Medicine:  Fransico Him, MD Electrophysiologist:  None   Chief Complaint:  OSA  History of Present Illness:    Miguel Stevens is a 64 y.o. male who presents via audio/video conferencing for a telehealth visit today.    This is a 64yo male with a hx of PAF, GERD and HTN who was referred by Dr. Tamala Julian for sleep evaluation.  He was seen by Dr. Tamala Julian recently and mentioned that he snores but did not have excessive daytime sleepiness.  Due to hx of PAF a sleep study was  recommended.  He underwent PSG which showed moderate OSA with an AHI of 16.5/hr and O2 sats as low as 81% with nocturnal hypoxemia.  CPAP titration was recommended but before proceeding with CPAP he wanted an OV to discuss his treatment options.    The patient does not have symptoms concerning for COVID-19 infection (fever, chills, cough, or new shortness of breath).    Prior CV studies:   The following studies were reviewed today:  Sleep study, OV notes from Dr. Tamala Julian  Past Medical History:  Diagnosis Date  . Atrial fibrillation ([place])    Lone atrial fibrillation by Holter July 2009. This echo demonstrates normal left ventricle size and function. LA size 42 mm   . Back pain   . GERD (gastroesophageal reflux disease)   . Hypertension    Past Surgical History:  Procedure Laterality Date  . wisdom teeth excised       Current Meds  Medication Sig  . aspirin 81 MG tablet Take 81 mg by mouth daily.  Marland Kitchen FAMOTIDINE PO Take 20 mg by mouth 2 (two) times daily.   . metoprolol succinate (TOPROL-XL) 50 MG 24 hr tablet Take 1 tablet (50 mg total) by mouth daily.  . metroNIDAZOLE (METROGEL) 1 % gel Apply 1 application topically daily.  . Multiple Vitamin (MULTIVITAMIN) tablet Take 1 tablet by mouth daily.     Allergies:   Patient has no known allergies.   Social History   Tobacco Use  . Smoking status:  Never Smoker  . Smokeless tobacco: Never Used  Substance Use Topics  . Alcohol use: Yes    Alcohol/week: 0.0 standard drinks    Comment: an occasionally beer .  Marland Kitchen Drug use: No     Family Hx: The patient's family history includes Heart disease in his father and mother.  ROS:   Please see the history of present illness.     All other systems reviewed and are negative.   Labs/Other Tests and Data Reviewed:    Recent Labs: No results found for requested labs within last 8760 hours.   Recent Lipid Panel No results found for: CHOL, TRIG, HDL, CHOLHDL, LDLCALC, LDLDIRECT  Wt  Readings from Last 3 Encounters:  01/29/20 205 lb (93 kg)  01/07/20 210 lb (95.3 kg)  11/27/19 218 lb (98.9 kg)     Objective:    Vital Signs:  Ht 5\' 10"  (1.778 m)   Wt 205 lb (93 kg)   BMI 29.41 kg/m     ASSESSMENT & PLAN:    1.  OSA -moderate with an AHI of 18/hr with nocturnal hypoxemia -he denies significant excessive daytime sleepiness but has been told he snores -I reviewed the findings of the sleep study with the patient and given his PAF, I have recommended proceeding with CPAP titration.   -we also discussed an oral appliance which is less desirable given the degree of his OSA and hypoxemia. -he would also qualify for the Hypoglossal nerve stimulator but would prefer he try CPAP first since the later requires a surgical procedure. -I will set him up for Cpap titration despite being hesitant   2.  PAF -he thinks he is in NSR -continue Toprol XL 50mg  daily -not on anticoagulation due to [facility] score of only 1 (HTN).  3.  HTN -continue Toprol XL 50mg  daily  4.  Obesity -I have encouraged him to get into a routine exercise program and cut back on carbs and portions.   COVID-19 Education: The signs and symptoms of COVID-19 were discussed with the patient and how to seek care for testing (follow up with PCP or arrange E-visit).  The importance of social distancing was discussed today.  Patient Risk:   After full review of this patient's clinical status, I feel that they are at least moderate risk at this time.  Time:   Today, I have spent 20 minutes directly with the patient on telemedicine discussing medical problems including OSA with its pathology and associated treatments, PAF, HTN, obesity as well as reviewing patient's chart including prior OV notes from Dr. and sleep study.  Medication Adjustments/Labs and Tests Ordered: Current medicines are reviewed at length with the patient today.  Concerns regarding medicines are outlined above.  Tests  Ordered: No orders of the defined types were placed in this encounter.  Medication Changes: No orders of the defined types were placed in this encounter.   Disposition:  Follow up in 10 week(s)  Signed, [facility], MD  01/29/2020 8:21 AM    [facility]

## 2020-01-29 ENCOUNTER — Encounter: Payer: Self-pay | Admitting: Cardiology

## 2020-01-29 ENCOUNTER — Other Ambulatory Visit: Payer: Self-pay

## 2020-01-29 ENCOUNTER — Telehealth: Payer: Self-pay | Admitting: *Deleted

## 2020-01-29 ENCOUNTER — Telehealth (INDEPENDENT_AMBULATORY_CARE_PROVIDER_SITE_OTHER): Payer: BC Managed Care – PPO | Admitting: Cardiology

## 2020-01-29 VITALS — Ht 70.0 in | Wt 205.0 lb

## 2020-01-29 DIAGNOSIS — I480 Paroxysmal atrial fibrillation: Secondary | ICD-10-CM | POA: Diagnosis not present

## 2020-01-29 DIAGNOSIS — G4733 Obstructive sleep apnea (adult) (pediatric): Secondary | ICD-10-CM

## 2020-01-29 DIAGNOSIS — E669 Obesity, unspecified: Secondary | ICD-10-CM | POA: Diagnosis not present

## 2020-01-29 DIAGNOSIS — I10 Essential (primary) hypertension: Secondary | ICD-10-CM | POA: Diagnosis not present

## 2020-01-29 NOTE — Telephone Encounter (Signed)
Per Dr Mayford Knife:  Please set him up for CPAP titration

## 2020-01-29 NOTE — Telephone Encounter (Signed)
CPAP titration ordered and sent to sleep pool.

## 2020-01-29 NOTE — Telephone Encounter (Signed)
Staff message sent to [facility] per Cammie Mcgee with [employer] no PA is required. Ok to schedule sleep study.

## 2020-02-07 ENCOUNTER — Telehealth: Payer: Self-pay | Admitting: *Deleted

## 2020-02-07 NOTE — Telephone Encounter (Signed)
Patient is scheduled for lab study on 02/16/20. Pt is scheduled for COVID screening on 02/13/20 2:45 prior to titration.  Patient understands his sleep study will be done at [facility] sleep lab. Patient understands he will receive a sleep packet in a week or so. Patient understands to call if he does not receive the sleep packet in a timely manner.  Left detailed message on voicemail with date and time of titration and informed patient to call back to confirm or reschedule.

## 2020-02-07 NOTE — Telephone Encounter (Signed)
-----   Message from Gaynelle Cage, CMA sent at 01/29/2020  2:29 PM EST ----- Regarding: RE: precert Per Cammie Mcgee with [employer] no PA is required. Ok to schedule. ----- Message ----- From: Reesa Chew, CMA Sent: 01/29/2020   9:25 AM EST To: Cv Div Sleep Studies Subject: precert                                        Please set him up for CPAP titration

## 2020-02-13 ENCOUNTER — Other Ambulatory Visit (HOSPITAL_COMMUNITY): Payer: BC Managed Care – PPO

## 2020-02-16 ENCOUNTER — Encounter (HOSPITAL_BASED_OUTPATIENT_CLINIC_OR_DEPARTMENT_OTHER): Payer: BC Managed Care – PPO | Admitting: Cardiology

## 2020-02-22 ENCOUNTER — Inpatient Hospital Stay (HOSPITAL_COMMUNITY): Admission: RE | Admit: 2020-02-22 | Payer: BC Managed Care – PPO | Source: Ambulatory Visit

## 2020-02-25 ENCOUNTER — Encounter (HOSPITAL_BASED_OUTPATIENT_CLINIC_OR_DEPARTMENT_OTHER): Payer: BC Managed Care – PPO | Admitting: Cardiology

## 2020-03-11 ENCOUNTER — Ambulatory Visit: Payer: BC Managed Care – PPO | Admitting: Interventional Cardiology

## 2020-03-21 NOTE — Telephone Encounter (Signed)
[employer] has not received monitor back from patient. They have attempted to contact the patient on 3 occasions to request monitor be returned to [employer].  CANCELLED ORDER 

## 2020-03-27 ENCOUNTER — Telehealth: Payer: Self-pay | Admitting: Interventional Cardiology

## 2020-03-27 ENCOUNTER — Encounter: Payer: Self-pay | Admitting: *Deleted

## 2020-03-27 NOTE — Telephone Encounter (Signed)
Spoke with pt and he states that he cancelled tomorrow's appt because he never received the second monitor that was to be sent to him.  Pt still hasn't returned the previous monitor.  Advised pt to go ahead and return the monitor that he has and I will send a message to our monitor tech and see about getting the new monitor sent out.  Advised pt to contact the office once he receives the monitor so we can get him rescheduled to come in and see Dr. Katrinka Blazing.

## 2020-03-27 NOTE — Progress Notes (Signed)
Patient ID: Miguel Stevens, male   DOB: 24-Jun-1956, 64 y.o.   MRN: [id number] Patient enrolled for a second 7 day ZIO XT long term holter monitor to be mailed to his home.   Patient did not wear or return the first monitor to [facility]. Instructions sent to patient via My Chart Message and will be included in his monitor kit as well. If patient applies when received and mailes back to [facility] immediately after 7 days of wearing, results should be available by 04/13/2020.

## 2020-03-27 NOTE — Telephone Encounter (Signed)
New Message   Patient in December was sent a monitor from [facility]. He states that he was not able to use that monitor so he was to receive another one. He was under the impression that they would ship him a new monitor and then he would return the old monitor. So at this point patient wore the monitor. In the meantime the patient was to see Dr. Katrinka Blazing. He did cancel that appt today because the appt was for him to be seen after wearing the long term monitor which has not occurred. Please contact patient and advise.

## 2020-03-28 ENCOUNTER — Ambulatory Visit: Payer: BC Managed Care – PPO | Admitting: Interventional Cardiology

## 2020-04-07 ENCOUNTER — Ambulatory Visit (INDEPENDENT_AMBULATORY_CARE_PROVIDER_SITE_OTHER): Payer: BC Managed Care – PPO

## 2020-04-07 DIAGNOSIS — I4891 Unspecified atrial fibrillation: Secondary | ICD-10-CM

## 2020-04-09 NOTE — Progress Notes (Signed)
Spoke with pt and he applied the monitor on Sunday at 5pm.  Scheduled pt to come in and see Dr. Katrinka Blazing on 5/11 at 11:40A.  Advised him to mail monitor back on Monday or Tuesday next week so we can get the results back in time for his appt.  Pt agreeable to plan.

## 2020-04-29 NOTE — Progress Notes (Signed)
Cardiology Office Note:    Date:  04/30/2020   ID:  [name], DOB [birth year], MRN [id number]  PCP:  Shirlean Mylar, MD  Cardiologist:  Lesleigh Noe, MD   Referring MD: Quintella Reichert, MD   Chief Complaint  Patient presents with  . Atrial Fibrillation  . Advice Only    Obstructive sleep apnea    History of Present Illness:    Miguel Stevens is a 64 y.o. male with a hx of PAF (>20years), hypertension, hyperlipidemia, GERD, and recently diagnosed OSA treated with CPAP.  Here to discuss atrial for ablation.  Recent monitor demonstrated a burden of 65%.  Poor heart rate control at those times.  When in atrial fibrillation he can feel very nonconsequential palpitations.  There is never chest discomfort, lightheadedness, dizziness, or other complaints.  Obstructive sleep apnea has been confirmed on the recent sleep study.  Past Medical History:  Diagnosis Date  . Atrial fibrillation ([facility])    Lone atrial fibrillation by Holter July 2009. This echo demonstrates normal left ventricle size and function. LA size 42 mm   . Back pain   . GERD (gastroesophageal reflux disease)   . Hypertension     Past Surgical History:  Procedure Laterality Date  . wisdom teeth excised      Current Medications: Current Meds  Medication Sig  . FAMOTIDINE PO Take 20 mg by mouth 2 (two) times daily.   . metroNIDAZOLE (METROGEL) 1 % gel Apply 1 application topically daily.  . Multiple Vitamin (MULTIVITAMIN) tablet Take 1 tablet by mouth daily.  . [DISCONTINUED] aspirin 81 MG tablet Take 81 mg by mouth daily.  . [DISCONTINUED] metoprolol succinate (TOPROL-XL) 50 MG 24 hr tablet Take 1 tablet (50 mg total) by mouth daily.     Allergies:   Patient has no known allergies.   Social History   Socioeconomic History  . Marital status: Married    Spouse name: Not on file  . Number of children: Not on file  . Years of education: Not on file  . Highest education level: Not on file    Occupational History  . Not on file  Tobacco Use  . Smoking status: Never Smoker  . Smokeless tobacco: Never Used  Substance and Sexual Activity  . Alcohol use: Yes    Alcohol/week: 0.0 standard drinks    Comment: an occasionally beer .  Marland Kitchen Drug use: No  . Sexual activity: Not on file  Other Topics Concern  . Not on file  Social History Narrative  . Not on file   Social Determinants of Health   Financial Resource Strain:   . Difficulty of Paying Living Expenses:   Food Insecurity:   . Worried About Programme researcher, broadcasting/film/video in the Last Year:   . Barista in the Last Year:   Transportation Needs:   . Freight forwarder (Medical):   Marland Kitchen Lack of Transportation (Non-Medical):   Physical Activity:   . Days of Exercise per Week:   . Minutes of Exercise per Session:   Stress:   . Feeling of Stress :   Social Connections:   . Frequency of Communication with Friends and Family:   . Frequency of Social Gatherings with Friends and Family:   . Attends Religious Services:   . Active Member of Clubs or Organizations:   . Attends Banker Meetings:   Marland Kitchen Marital Status:      Family History: The patient's family  history includes Heart disease in his father and mother.  ROS:   Please see the history of present illness.    Gained significant weight.  Somewhat depressed about the recent findings of atrial fibrillation and sleep apnea.  All other systems reviewed and are negative.  EKGs/Labs/Other Studies Reviewed:    The following studies were reviewed today: 7-day monitor 2021:  A. fib burden greater than 67% with poor ventricular response heart rate control.  Heart rate range 47 to 188 bpm.  Sleep study 2021:  Positive for obstructive sleep apnea  2D Doppler echocardiogram 2020, December: IMPRESSIONS    1. Left ventricular ejection fraction, by visual estimation, is 60 to  65%. The left ventricle has normal function. There is mildly increased  left  ventricular hypertrophy.  2. Left ventricular diastolic function could not be evaluated.  3. The left ventricle has no regional wall motion abnormalities.  4. Global right ventricle has normal systolic function.The right  ventricular size is normal. No increase in right ventricular wall  thickness.  5. Left atrial size was mildly dilated.  6. Right atrial size was normal.  7. The mitral valve is normal in structure. Trivial mitral valve  regurgitation. No evidence of mitral stenosis.  8. The tricuspid valve is normal in structure. Tricuspid valve  regurgitation is trivial.  9. The aortic valve is normal in structure. Aortic valve regurgitation is  not visualized. No evidence of aortic valve sclerosis or stenosis.  10. The pulmonic valve was normal in structure. Pulmonic valve  regurgitation is not visualized.  11. The inferior vena cava is normal in size with greater than 50%  respiratory variability, suggesting right atrial pressure of 3 mmHg.    EKG:  EKG not repeated  Recent Labs: No results found for requested labs within last 8760 hours.  Recent Lipid Panel No results found for: CHOL, TRIG, HDL, CHOLHDL, VLDL, LDLCALC, LDLDIRECT  Physical Exam:    VS:  BP 122/86   Pulse 78   Ht 5\' 10"  (1.778 m)   Wt 223 lb (101.2 kg)   SpO2 98%   BMI 32.00 kg/m     Wt Readings from Last 3 Encounters:  04/30/20 223 lb (101.2 kg)  01/29/20 205 lb (93 kg)  01/07/20 210 lb (95.3 kg)     GEN: Moderate obesity. No acute distress HEENT: Normal NECK: No JVD. LYMPHATICS: No lymphadenopathy CARDIAC:  RRR without murmur, gallop, or edema. VASCULAR:  Normal Pulses. No bruits. RESPIRATORY:  Clear to auscultation without rales, wheezing or rhonchi  ABDOMEN: Soft, non-tender, non-distended, No pulsatile mass, MUSCULOSKELETAL: No deformity  SKIN: Warm and dry NEUROLOGIC:  Alert and oriented x 3 PSYCHIATRIC:  Normal affect   ASSESSMENT:    1. PAF (paroxysmal atrial fibrillation)  (HCC)   2. Other hyperlipidemia   3. OSA (obstructive sleep apnea)   4. Essential hypertension   5. Educated about COVID-19 virus infection    PLAN:    In order of problems listed above:  1. A. fib as noted above.  He has a coronary calcium score of 1, hypertension, and therefore CHADS Vasc score of 2.  Because of the poor heart rate control increase Toprol-XL to 100 mg/day.  I would like to decrease the burden of atrial fibrillation.  I have strongly encouraged him to go forward with therapy for obstructive sleep apnea.  I will refer him to electrophysiology to consider flecainide or other antiarrhythmic therapy versus A. fib ablation as his left atrial architecture is still reasonable.  Start Xarelto 20 mg/day and stop aspirin. 2. Minimal coronary calcium by CT score.  LDL target should be less than 70. 3. He is ambivalent about sleep management.  I strongly encouraged that he give it a try. 4. Target blood pressure 130/80 mmHg.  Continue metoprolol but at 100 mg/day. 5. Vaccine has been received.  Social distancing is being practiced.  Clinical follow-up with me in 6 months.  Prolonged office visit that included counseling concerning anticoagulation, management of sleep apnea, and downstream management of atrial fibrillation with potential impact on natural history.  Medication Adjustments/Labs and Tests Ordered: Current medicines are reviewed at length with the patient today.  Concerns regarding medicines are outlined above.  Orders Placed This Encounter  Procedures  . Ambulatory referral to Cardiac Electrophysiology   Meds ordered this encounter  Medications  . rivaroxaban (XARELTO) 20 MG TABS tablet    Sig: Take 1 tablet (20 mg total) by mouth daily with supper.    Dispense:  90 tablet    Refill:  3  . metoprolol succinate (TOPROL-XL) 100 MG 24 hr tablet    Sig: Take 1 tablet (100 mg total) by mouth daily. Take with or immediately following a meal.    Dispense:  90 tablet     Refill:  3    Dose change    There are no Patient Instructions on file for this visit.   Signed, Sinclair Grooms, MD  04/30/2020 12:52 PM    [place]

## 2020-04-30 ENCOUNTER — Encounter: Payer: Self-pay | Admitting: Interventional Cardiology

## 2020-04-30 ENCOUNTER — Ambulatory Visit: Payer: BC Managed Care – PPO | Admitting: Interventional Cardiology

## 2020-04-30 ENCOUNTER — Other Ambulatory Visit: Payer: Self-pay

## 2020-04-30 VITALS — BP 122/86 | HR 78 | Ht 70.0 in | Wt 223.0 lb

## 2020-04-30 DIAGNOSIS — I10 Essential (primary) hypertension: Secondary | ICD-10-CM

## 2020-04-30 DIAGNOSIS — G4733 Obstructive sleep apnea (adult) (pediatric): Secondary | ICD-10-CM

## 2020-04-30 DIAGNOSIS — I480 Paroxysmal atrial fibrillation: Secondary | ICD-10-CM | POA: Diagnosis not present

## 2020-04-30 DIAGNOSIS — E7849 Other hyperlipidemia: Secondary | ICD-10-CM

## 2020-04-30 DIAGNOSIS — Z7189 Other specified counseling: Secondary | ICD-10-CM

## 2020-04-30 MED ORDER — RIVAROXABAN 20 MG PO TABS: 20.0000 mg | ORAL_TABLET | Freq: Every day | ORAL | 3 refills | Status: DC

## 2020-04-30 MED ORDER — METOPROLOL SUCCINATE ER 100 MG PO TB24: 100.0000 mg | ORAL_TABLET | Freq: Every day | ORAL | 3 refills | Status: DC

## 2020-04-30 NOTE — Patient Instructions (Signed)
Medication Instructions:  1) INCREASE Metoprolol to 100mg  once daily 2) START Xarelto 20mg  once daily 3) DISCONTINUE Aspirin  *If you need a refill on your cardiac medications before your next appointment, please call your pharmacy*   Lab Work: None  If you have labs (blood work) drawn today and your tests are completely normal, you will receive your results only by: MyChart Message (if you have MyChart) OR . A paper copy in the mail If you have any lab test that is abnormal or we need to change your treatment, we will call you to review the results.   Testing/Procedures: None   Follow-Up: At [facility], you and your health needs are our priority.  As part of our continuing mission to provide you with exceptional heart care, we have created designated Provider Care Teams.  These Care Teams include your primary Cardiologist (physician) and Advanced Practice Providers (APPs -  Physician Assistants and Nurse Practitioners) who all work together to provide you with the care you need, when you need it.  We recommend signing up for the patient portal called "MyChart".  Sign up information is provided on this After Visit Summary.  MyChart is used to connect with patients for Virtual Visits (Telemedicine).  Patients are able to view lab/test results, encounter notes, upcoming appointments, etc.  Non-urgent messages can be sent to your provider as well.   To learn more about what you can do with MyChart, go to Marland Kitchen.    Your next appointment:   6 month(s)  The format for your next appointment:   In Person  Provider:   You may see [facility], MD or one of the following Advanced Practice Providers on your designated Care Team:    [email or web address], NP  Lesleigh Noe, NP  Norma Fredrickson, NP    Other Instructions  You have been referred to Dr. Nada Boozer with our Electrophysiology team to discuss Atrial Fib ablation.

## 2020-05-13 ENCOUNTER — Other Ambulatory Visit: Payer: Self-pay

## 2020-05-13 ENCOUNTER — Encounter: Payer: Self-pay | Admitting: Internal Medicine

## 2020-05-13 ENCOUNTER — Telehealth (INDEPENDENT_AMBULATORY_CARE_PROVIDER_SITE_OTHER): Payer: BC Managed Care – PPO | Admitting: Internal Medicine

## 2020-05-13 VITALS — Ht 70.0 in | Wt 223.0 lb

## 2020-05-13 DIAGNOSIS — I10 Essential (primary) hypertension: Secondary | ICD-10-CM

## 2020-05-13 DIAGNOSIS — D6869 Other thrombophilia: Secondary | ICD-10-CM | POA: Diagnosis not present

## 2020-05-13 DIAGNOSIS — I480 Paroxysmal atrial fibrillation: Secondary | ICD-10-CM

## 2020-05-13 DIAGNOSIS — G4733 Obstructive sleep apnea (adult) (pediatric): Secondary | ICD-10-CM | POA: Diagnosis not present

## 2020-05-13 NOTE — Progress Notes (Signed)
Electrophysiology TeleHealth Note   Due to national recommendations of social distancing due to [place] 19, Audio/video telehealth visit is felt to be most appropriate for this patient at this time.  See MyChart message from today for patient consent regarding telehealth for [facility].   Date:  05/13/2020   ID:  Miguel Stevens, DOB 07-Aug-1956, MRN [id number]  Location: home Provider location: [place] Evaluation Performed: New patient consult  PCP:  Maurice Small, MD  Cardiologist:  Sinclair Grooms, MD  Electrophysiologist:  None   Chief Complaint:  afib  History of Present Illness:    Miguel Stevens is a 64 y.o. male who presents via audio/video conferencing for a telehealth visit today.   The patient is referred for new consultation regarding afib by Dr Tamala Julian.  He reports being diagnosed with atrial arrhythmias in his 60s.  This was primarily associated with alcohol consumption at that time. He had afib diagnosed with afib on a monitor in 2009.   He has been treated with metoprolol since that time.  He reports symptoms of palpitations on occasion. He had afib on ekg 11/27/2019.  He had an event monitor placed 04/2020 which revealed afib burden of 67%.  His heart rate range is 47-188 bpm.  He is anticoagulated with xarelto. In general, he feels ok.  He is primarily limited by orthopedic issues.  He has occasional fatigue.  He [place]. Today, he denies symptoms of chest pain, shortness of breath, orthopnea, PND, lower extremity edema, claudication, dizziness, presyncope, syncope, bleeding, or neurologic sequela. The patient is tolerating medications without difficulties and is otherwise without complaint today.     Past Medical History:  Diagnosis Date  . Back pain   . GERD (gastroesophageal reflux disease)   . Hypertension   . OSA (obstructive sleep apnea)    not compliant with cpap  . Overweight   . Paroxysmal atrial  fibrillation ([place])    Lone atrial fibrillation by Holter July 2009. This echo demonstrates normal left ventricle size and function. LA size 42 mm     Past Surgical History:  Procedure Laterality Date  . wisdom teeth excised      Current Outpatient Medications  Medication Sig Dispense Refill  . FAMOTIDINE PO Take 20 mg by mouth 2 (two) times daily.     . metoprolol succinate (TOPROL-XL) 100 MG 24 hr tablet Take 1 tablet (100 mg total) by mouth daily. Take with or immediately following a meal. 90 tablet 3  . metroNIDAZOLE (METROGEL) 1 % gel Apply 1 application topically daily.    . Multiple Vitamin (MULTIVITAMIN) tablet Take 1 tablet by mouth daily.    . rivaroxaban (XARELTO) 20 MG TABS tablet Take 1 tablet (20 mg total) by mouth daily with supper. 90 tablet 3   No current facility-administered medications for this visit.    Allergies:   Patient has no known allergies.   Social History:  The patient  reports that he has never smoked. He has never used smokeless tobacco. He reports current alcohol use. He reports that he does not use drugs.   Family History:  The patient's  family history includes Heart disease in his father and mother.    ROS:  Please see the history of present illness.   All other systems are personally reviewed and negative.    Exam:    Vital Signs:  Ht 5\' 10"  (1.778 m)  Wt 223 lb (101.2 kg)   BMI 32.00 kg/m    Well appearing, alert and conversant, verbose, regular work of breathing,  good skin color Eyes- anicteric, neuro- grossly intact, skin- no apparent rash or lesions or cyanosis, mouth- oral mucosa is pink   Labs/Other Tests and Data Reviewed:    Recent Labs: No results found for requested labs within last 8760 hours.   Wt Readings from Last 3 Encounters:  05/13/20 223 lb (101.2 kg)  04/30/20 223 lb (101.2 kg)  01/29/20 205 lb (93 kg)     Other studies personally reviewed: Additional studies/ records that were reviewed today include: Dr  Lonn Georgia notes, recent event monitor, prior echo  Review of the above records today demonstrates: as above   ASSESSMENT & PLAN:    1.  Paroxysmal atrial fibrillation The patient has symptomatic, recurrent paroxysmal atrial fibrillation.  Chads2vasc score is 1.  he is anticoagulated with xarelto . Therapeutic strategies for afib including medicine (flecainide, multaq) and ablation were discussed in detail with the patient today. Risk, benefits, and alternatives to EP study and radiofrequency ablation for afib were also discussed in detail today. These risks include but are not limited to stroke, bleeding, vascular damage, tamponade, perforation, damage to the esophagus, lungs, and other structures, pulmonary vein stenosis, worsening renal function, and death. The patient understands these risk and wishes to think about this further with his spouse.  If he decides to proceed, we would require Carto, ICE, anesthesia for the procedure.  Will also obtain cardiac CT prior to the procedure to exclude LAA thrombus and further evaluate atrial anatomy.  2. OSA Compliance with CPAP are advised  3. Overweight Lifestyle modification advised  4. HTN Stable No change required today   Patient Risk:  after full review of this patients clinical status, I feel that they are at moderate risk at this time.   Today, I have spent 25 minutes with the patient with telehealth technology discussing afib .    SignedHillis Range MD, [facility] 05/13/2020 4:01 PM   [facility] [address] [place] [address] [phone] (office) [phone] (fax)

## 2020-05-24 ENCOUNTER — Telehealth: Payer: Self-pay

## 2020-05-24 NOTE — Telephone Encounter (Signed)
Left detailed message for Pt.  Advised if Pt would like to schedule afib ablation to call office or send a mychart message.

## 2020-07-30 ENCOUNTER — Telehealth: Payer: Self-pay | Admitting: Internal Medicine

## 2020-07-30 NOTE — Telephone Encounter (Signed)
° °  Pt is calling, he would like to schedule his micro ablation procedure. He also wanted to know if its ok to schedule his cpap titration now or wait for the procedure first.

## 2020-08-12 NOTE — Telephone Encounter (Signed)
Patient is scheduled for CPAP Titration on 09/15/20. Patient understands his titration study will be done at [facility] sleep lab. Patient understands he will receive a letter in a week or so detailing appointment, date, time, and location. Patient understands to call if he does not receive the letter  in a timely manner. Patient agrees with treatment and thanked me for call.

## 2020-08-14 ENCOUNTER — Telehealth: Payer: Self-pay | Admitting: *Deleted

## 2020-08-14 DIAGNOSIS — I4891 Unspecified atrial fibrillation: Secondary | ICD-10-CM

## 2020-08-14 DIAGNOSIS — I480 Paroxysmal atrial fibrillation: Secondary | ICD-10-CM

## 2020-08-14 NOTE — Telephone Encounter (Signed)
Left voicemail to call back for help scheduling the ablation.

## 2020-08-19 NOTE — Telephone Encounter (Signed)
Spoke with the patient about scheduling the ablation. Pre testing lab, covid, CT, and procedure. As well as post care restrictions and time frames. Gave a few open dates to look at. The patient will call me back when he is ready to schedule.

## 2020-08-20 ENCOUNTER — Other Ambulatory Visit: Payer: Self-pay | Admitting: *Deleted

## 2020-08-20 NOTE — Telephone Encounter (Signed)
Patient is returning Miguel Stevens's call regarding the ablation. Please call back.

## 2020-08-20 NOTE — Telephone Encounter (Signed)
Scheduled ablation, labs and covid testing.   Advised patient to contact us if he has any questions.

## 2020-08-20 NOTE — Addendum Note (Signed)
Addended by: Sheppard Evens E on: 08/20/2020 01:00 PM   Modules accepted: Orders

## 2020-08-29 ENCOUNTER — Encounter: Payer: Self-pay | Admitting: *Deleted

## 2020-09-15 ENCOUNTER — Ambulatory Visit (HOSPITAL_BASED_OUTPATIENT_CLINIC_OR_DEPARTMENT_OTHER): Payer: BC Managed Care – PPO | Attending: Cardiology | Admitting: Cardiology

## 2020-09-15 ENCOUNTER — Other Ambulatory Visit: Payer: Self-pay

## 2020-09-15 DIAGNOSIS — I480 Paroxysmal atrial fibrillation: Secondary | ICD-10-CM | POA: Diagnosis not present

## 2020-09-15 DIAGNOSIS — G4733 Obstructive sleep apnea (adult) (pediatric): Secondary | ICD-10-CM | POA: Diagnosis present

## 2020-09-15 DIAGNOSIS — I471 Supraventricular tachycardia: Secondary | ICD-10-CM | POA: Diagnosis not present

## 2020-09-16 NOTE — Procedures (Signed)
   Patient Name: Miguel Stevens, Miguel Stevens Date: 09/15/2020 Gender: Male D.O.B: May 07, 1956 Age (years): 63 Referring Provider: Verdis Prime Height (inches): 70 Interpreting Physician: Armanda Magic MD, ABSM Weight (lbs): 210 RPSGT: Rosette Reveal BMI: 30 MRN: [id number] Neck Size: 18.00  CLINICAL INFORMATION The patient is referred for a CPAP titration to treat sleep apnea.  SLEEP STUDY TECHNIQUE As per the AASM Manual for the Scoring of Sleep and Associated Events v2.3 (April 2016) with a hypopnea requiring 4% desaturations.  The channels recorded and monitored were frontal, central and occipital EEG, electrooculogram (EOG), submentalis EMG (chin), nasal and oral airflow, thoracic and abdominal wall motion, anterior tibialis EMG, snore microphone, electrocardiogram, and pulse oximetry. Continuous positive airway pressure (CPAP) was initiated at the beginning of the study and titrated to treat sleep-disordered breathing.  MEDICATIONS Medications self-administered by patient taken the night of the study : N/A  TECHNICIAN COMMENTS Comments added by technician: None Comments added by scorer: N/A  RESPIRATORY PARAMETERS Optimal PAP Pressure (cm): 15  AHI at Optimal Pressure (/hr):0.3 Overall Minimal O2 (%):90.0  Supine % at Optimal Pressure (%):70 Minimal O2 at Optimal Pressure (%): 93.0   SLEEP ARCHITECTURE The study was initiated at 9:57:45 PM and ended at 5:03:26 AM.  Sleep onset time was 3.0 minutes and the sleep efficiency was 79.2%. The total sleep time was 337 minutes.  The patient spent 10.1% of the night in stage N1 sleep, 63.5% in stage N2 sleep, 0.0% in stage N3 and 26.4% in REM.Stage REM latency was 115.5 minutes  Wake after sleep onset was 85.7. Alpha intrusion was absent. Supine sleep was 81.31%.  CARDIAC DATA The 2 lead EKG demonstrated sinus rhythm. The mean heart rate was 50.7 beats per minute. Other EKG findings include: Atrial Fibrillation, PACs, nonsustained  atrial tachycardia.  LEG MOVEMENT DATA The total Periodic Limb Movements of Sleep (PLMS) were 0. The PLMS index was 0.0. A PLMS index of <15 is considered normal in adults.  IMPRESSIONS - The optimal PAP pressure was 15 cm of water. - Central sleep apnea was not noted during this titration (CAI = 0.5/h). - Significant oxygen desaturations were not observed during this titration (min O2 = 90.0%). - The patient snored with soft snoring volume during this titration study. - 2-lead EKG demonstrated: Atrial Fibrillation, PACs, nonsustained atrial tachycardia. - Clinically significant periodic limb movements were not noted during this study. Arousals associated with PLMs were rare.  DIAGNOSIS - Obstructive Sleep Apnea (G47.33) - Paroxysmal Atrial Fibrillation  RECOMMENDATIONS - Trial of CPAP therapy on 15 cm H2O with a Medium size Resmed Full Face Mask AirFit F10 mask and heated humidification. - Avoid alcohol, sedatives and other CNS depressants that may worsen sleep apnea and disrupt normal sleep architecture. - Sleep hygiene should be reviewed to assess factors that may improve sleep quality. - Weight management and regular exercise should be initiated or continued. - Return to Sleep Center for re-evaluation after 8 weeks of therapy  [Electronically signed] 09/16/2020 09:26 PM  Armanda Magic MD, ABSM Diplomate, American Board of Sleep Medicine

## 2020-09-19 ENCOUNTER — Other Ambulatory Visit: Payer: BC Managed Care – PPO | Admitting: *Deleted

## 2020-09-19 ENCOUNTER — Other Ambulatory Visit: Payer: Self-pay

## 2020-09-19 ENCOUNTER — Telehealth: Payer: Self-pay | Admitting: Interventional Cardiology

## 2020-09-19 ENCOUNTER — Telehealth: Payer: Self-pay | Admitting: *Deleted

## 2020-09-19 NOTE — Telephone Encounter (Signed)
Patient states he is on Amoxicillin and Loperamide for 10 days because he is having dental work and he would like to ensure that this will not interfere with lab work he is scheduled to have today. Please advise.

## 2020-09-19 NOTE — Telephone Encounter (Signed)
Answered all questions and patient verbalized understanding.  

## 2020-09-19 NOTE — Telephone Encounter (Signed)
Informed patient of sleep study results and patient understanding was verbalized. Patient understands his sleep study showed they had a successful PAP titration and let DME know that orders are in EPIC. Please set up 10 week OV with me.   Upon patient request DME selection is [facility]. Patient understands he will be contacted by [facility] to set up his cpap. Patient understands to call if [facility] does not contact him with new setup in a timely manner. Patient understands they will be called once confirmation has been received from [facility] that they have received their new machine to schedule 10 week follow up appointment.  [facility] notified of new cpap order  Please add to airview Patient was grateful for the call and thanked me.    

## 2020-09-19 NOTE — Telephone Encounter (Signed)
-----   Message from Quintella Reichert, MD sent at 09/16/2020  9:29 PM EDT ----- Please let patient know that they had a successful PAP titration and let DME know that orders are in EPIC.  Please set up 10 week OV with me.

## 2020-09-20 LAB — CBC WITH DIFFERENTIAL/PLATELET
Basophils Absolute: 0.1 10*3/uL (ref 0.0–0.2)
Basos: 1 %
EOS (ABSOLUTE): 0.2 10*3/uL (ref 0.0–0.4)
Eos: 4 %
Hematocrit: 46.7 % (ref 37.5–51.0)
Hemoglobin: 16.4 g/dL (ref 13.0–17.7)
Immature Grans (Abs): 0 10*3/uL (ref 0.0–0.1)
Immature Granulocytes: 0 %
Lymphocytes Absolute: 2.4 10*3/uL (ref 0.7–3.1)
Lymphs: 37 %
MCH: 29.3 pg (ref 26.6–33.0)
MCHC: 35.1 g/dL (ref 31.5–35.7)
MCV: 84 fL (ref 79–97)
Monocytes Absolute: 0.6 10*3/uL (ref 0.1–0.9)
Monocytes: 9 %
Neutrophils Absolute: 3.2 10*3/uL (ref 1.4–7.0)
Neutrophils: 49 %
Platelets: 197 10*3/uL (ref 150–450)
RBC: 5.59 x10E6/uL (ref 4.14–5.80)
RDW: 13.1 % (ref 11.6–15.4)
WBC: 6.5 10*3/uL (ref 3.4–10.8)

## 2020-09-20 LAB — BASIC METABOLIC PANEL
BUN/Creatinine Ratio: 13 (ref 10–24)
BUN: 17 mg/dL (ref 8–27)
CO2: 27 mmol/L (ref 20–29)
Calcium: 9.2 mg/dL (ref 8.6–10.2)
Chloride: 106 mmol/L (ref 96–106)
Creatinine, Ser: 1.31 mg/dL — ABNORMAL HIGH (ref 0.76–1.27)
GFR calc Af Amer: 67 mL/min/{1.73_m2} (ref >59)
GFR calc non Af Amer: 58 mL/min/{1.73_m2} — ABNORMAL LOW (ref >59)
Glucose: 96 mg/dL (ref 65–99)
Potassium: 4.3 mmol/L (ref 3.5–5.2)
Sodium: 143 mmol/L (ref 134–144)

## 2020-10-02 ENCOUNTER — Telehealth: Payer: Self-pay | Admitting: *Deleted

## 2020-10-02 ENCOUNTER — Telehealth (HOSPITAL_COMMUNITY): Payer: Self-pay | Admitting: Emergency Medicine

## 2020-10-02 NOTE — Telephone Encounter (Signed)
Reaching out to patient to offer assistance regarding upcoming cardiac imaging study; pt verbalizes understanding of appt date/time, parking situation and where to check in, pre-test NPO status and medications ordered, and verified current allergies; name and call back number provided for further questions should they arise Camyla Camposano RN Navigator Cardiac Imaging [facility] [phone] office [phone] cell 

## 2020-10-02 NOTE — Telephone Encounter (Signed)
YOUR CARDIOLOGY TEAM HAS ARRANGED FOR AN E-VISIT FOR YOUR APPOINTMENT - PLEASE REVIEW IMPORTANT INFORMATION BELOW SEVERAL DAYS PRIOR TO YOUR APPOINTMENT  Due to the recent COVID-19 pandemic, we are transitioning in-person office visits to tele-medicine visits in an effort to decrease unnecessary exposure to our patients, their families, and staff. These visits are billed to your insurance just like a normal visit is. We also encourage you to sign up for MyChart if you have not already done so. You will need a smartphone if possible. For patients that do not have this, we can still complete the visit using a regular telephone but do prefer a smartphone to enable video when possible. You may have a family member that lives with you that can help. If possible, we also ask that you have a blood pressure cuff and scale at home to measure your blood pressure, heart rate and weight prior to your scheduled appointment. Patients with clinical needs that need an in-person evaluation and testing will still be able to come to the office if absolutely necessary. If you have any questions, feel free to call our office.     YOUR PROVIDER WILL BE USING THE FOLLOWING PLATFORM TO COMPLETE YOUR VISIT: Staff: Please delete this text and fill in MyChart/[email or web address]  . IF USING MYCHART - How to Download the MyChart App to Your SmartPhone   - If Apple, go to App Store and type in MyChart in the search bar and download the app. If Android, ask patient to go to Google Play Store and type in MyChart in the search bar and download the app. The app is free but as with any other app downloads, your phone may require you to verify saved payment information or Apple/Android password.  - You will need to then log into the app with your MyChart username and password, and select [facility] as your healthcare provider to link the account.  - When it is time for your visit, go to the MyChart app, find appointments, and click Begin  Video Visit. Be sure to Select Allow for your device to access the Microphone and Camera for your visit. You will then be connected, and your provider will be with you shortly.  **If you have any issues connecting or need assistance, please contact MyChart service desk [phone] ([phone])**  **If using a computer, in order to ensure the best quality for your visit, you will need to use either of the following Internet Browsers: Google Chrome or Microsoft Edge**  . IF USING DOXIMITY or [email or web address].ME - The staff will give you instructions on receiving your link to join the meeting the day of your visit.      THE DAY OF YOUR APPOINTMENT  Approximately 15 minutes prior to your scheduled appointment, you will receive a telephone call from one of [facility] team - your caller ID may say "Unknown caller."  Our staff will confirm medications, vital signs for the day and any symptoms you may be experiencing. Please have this information available prior to the time of visit start. It may also be helpful for you to have a pad of paper and pen handy for any instructions given during your visit. They will also walk you through joining the smartphone meeting if this is a video visit.    CONSENT FOR TELE-HEALTH VISIT - PLEASE REVIEW  I hereby voluntarily request, consent and authorize [facility] and its employed or contracted physicians, physician assistants, nurse practitioners or other licensed health care professionals (the   Practitioner), to provide me with telemedicine health care services (the "Services") as deemed necessary by the treating Practitioner. I acknowledge and consent to receive the Services by the Practitioner via telemedicine. I understand that the telemedicine visit will involve communicating with the Practitioner through live audiovisual communication technology and the disclosure of certain medical information by electronic transmission. I acknowledge that I have been given the  opportunity to request an in-person assessment or other available alternative prior to the telemedicine visit and am voluntarily participating in the telemedicine visit.  I understand that I have the right to withhold or withdraw my consent to the use of telemedicine in the course of my care at any time, without affecting my right to future care or treatment, and that the Practitioner or I may terminate the telemedicine visit at any time. I understand that I have the right to inspect all information obtained and/or recorded in the course of the telemedicine visit and may receive copies of available information for a reasonable fee.  I understand that some of the potential risks of receiving the Services via telemedicine include:  . Delay or interruption in medical evaluation due to technological equipment failure or disruption; . Information transmitted may not be sufficient (e.g. poor resolution of images) to allow for appropriate medical decision making by the Practitioner; and/or  . In rare instances, security protocols could fail, causing a breach of personal health information.  Furthermore, I acknowledge that it is my responsibility to provide information about my medical history, conditions and care that is complete and accurate to the best of my ability. I acknowledge that Practitioner's advice, recommendations, and/or decision may be based on factors not within their control, such as incomplete or inaccurate data provided by me or distortions of diagnostic images or specimens that may result from electronic transmissions. I understand that the practice of medicine is not an exact science and that Practitioner makes no warranties or guarantees regarding treatment outcomes. I acknowledge that I will receive a copy of this consent concurrently upon execution via email to the email address I last provided but may also request a printed copy by calling the office of [facility].    I understand that  my insurance will be billed for this visit.   I have read or had this consent read to me. . I understand the contents of this consent, which adequately explains the benefits and risks of the Services being provided via telemedicine.  . I have been provided ample opportunity to ask questions regarding this consent and the Services and have had my questions answered to my satisfaction. . I give my informed consent for the services to be provided through the use of telemedicine in my medical care  By participating in this telemedicine visit I agree to the above. 

## 2020-10-03 ENCOUNTER — Other Ambulatory Visit: Payer: Self-pay

## 2020-10-03 ENCOUNTER — Ambulatory Visit (HOSPITAL_COMMUNITY)
Admission: RE | Admit: 2020-10-03 | Discharge: 2020-10-03 | Disposition: A | Discharge status: Home / Self Care | Payer: BC Managed Care – PPO | Source: Ambulatory Visit | Attending: Internal Medicine | Admitting: Internal Medicine

## 2020-10-03 DIAGNOSIS — I4891 Unspecified atrial fibrillation: Secondary | ICD-10-CM

## 2020-10-03 MED ORDER — IOHEXOL 350 MG/ML SOLN
80.0000 mL | Freq: Once | INTRAVENOUS | Status: AC | PRN
  Administered 2020-10-03: 80 mL via INTRAVENOUS

## 2020-10-05 ENCOUNTER — Other Ambulatory Visit (HOSPITAL_COMMUNITY): Payer: BC Managed Care – PPO

## 2020-10-07 ENCOUNTER — Other Ambulatory Visit (HOSPITAL_COMMUNITY)
Admission: RE | Admit: 2020-10-07 | Discharge: 2020-10-07 | Disposition: A | Discharge status: Home / Self Care | Payer: BC Managed Care – PPO | Source: Ambulatory Visit | Attending: Internal Medicine | Admitting: Internal Medicine

## 2020-10-07 DIAGNOSIS — Z01818 Encounter for other preprocedural examination: Secondary | ICD-10-CM | POA: Diagnosis present

## 2020-10-07 DIAGNOSIS — Z20822 Contact with and (suspected) exposure to covid-19: Secondary | ICD-10-CM | POA: Diagnosis not present

## 2020-10-07 LAB — SARS CORONAVIRUS 2 (TAT 6-24 HRS): SARS Coronavirus 2: NEGATIVE

## 2020-10-07 NOTE — Progress Notes (Signed)
Instructed patient on the following items: Arrival time 0830 Nothing to eat or drink after midnight No meds AM of procedure Responsible person to drive you home and stay with you for 24 hrs  Have you missed any doses of anti-coagulant Xarelto-hasn't missed any doses   

## 2020-10-08 ENCOUNTER — Ambulatory Visit (HOSPITAL_COMMUNITY): Payer: BC Managed Care – PPO | Admitting: Anesthesiology

## 2020-10-08 ENCOUNTER — Encounter (HOSPITAL_COMMUNITY)
Admission: RE | Disposition: A | Discharge status: Home / Self Care | Payer: Self-pay | Source: Home / Self Care | Attending: Internal Medicine

## 2020-10-08 ENCOUNTER — Encounter (HOSPITAL_COMMUNITY): Payer: Self-pay | Admitting: Internal Medicine

## 2020-10-08 ENCOUNTER — Ambulatory Visit (HOSPITAL_COMMUNITY)
Admission: RE | Admit: 2020-10-08 | Discharge: 2020-10-08 | Disposition: A | Discharge status: Home / Self Care | Payer: BC Managed Care – PPO | Attending: Internal Medicine | Admitting: Internal Medicine

## 2020-10-08 ENCOUNTER — Other Ambulatory Visit: Payer: Self-pay

## 2020-10-08 DIAGNOSIS — Z7901 Long term (current) use of anticoagulants: Secondary | ICD-10-CM | POA: Diagnosis not present

## 2020-10-08 DIAGNOSIS — I4819 Other persistent atrial fibrillation: Secondary | ICD-10-CM | POA: Diagnosis present

## 2020-10-08 DIAGNOSIS — K219 Gastro-esophageal reflux disease without esophagitis: Secondary | ICD-10-CM | POA: Insufficient documentation

## 2020-10-08 DIAGNOSIS — I480 Paroxysmal atrial fibrillation: Secondary | ICD-10-CM | POA: Diagnosis not present

## 2020-10-08 DIAGNOSIS — I10 Essential (primary) hypertension: Secondary | ICD-10-CM | POA: Insufficient documentation

## 2020-10-08 DIAGNOSIS — G4733 Obstructive sleep apnea (adult) (pediatric): Secondary | ICD-10-CM | POA: Diagnosis not present

## 2020-10-08 DIAGNOSIS — Z79899 Other long term (current) drug therapy: Secondary | ICD-10-CM | POA: Insufficient documentation

## 2020-10-08 HISTORY — PX: ATRIAL FIBRILLATION ABLATION: EP1191

## 2020-10-08 SURGERY — ATRIAL FIBRILLATION ABLATION
Anesthesia: General

## 2020-10-08 MED ORDER — SODIUM CHLORIDE 0.9 % IV SOLN: INTRAVENOUS | Status: DC

## 2020-10-08 MED ORDER — HEPARIN SODIUM (PORCINE) 1000 UNIT/ML IJ SOLN
INTRAMUSCULAR | Status: AC
  Filled 2020-10-08: qty 1

## 2020-10-08 MED ORDER — ISOPROTERENOL HCL 0.2 MG/ML IJ SOLN
INTRAMUSCULAR | Status: AC
  Filled 2020-10-08: qty 5

## 2020-10-08 MED ORDER — PROTAMINE SULFATE 10 MG/ML IV SOLN
INTRAVENOUS | Status: DC | PRN
  Administered 2020-10-08: 40 mg via INTRAVENOUS

## 2020-10-08 MED ORDER — HEPARIN (PORCINE) IN NACL 1000-0.9 UT/500ML-% IV SOLN
INTRAVENOUS | Status: DC | PRN
  Administered 2020-10-08: 500 mL

## 2020-10-08 MED ORDER — SODIUM CHLORIDE 0.9% FLUSH: 3.0000 mL | Freq: Two times a day (BID) | INTRAVENOUS | Status: DC

## 2020-10-08 MED ORDER — HEPARIN SODIUM (PORCINE) 1000 UNIT/ML IJ SOLN
INTRAMUSCULAR | Status: DC | PRN
  Administered 2020-10-08: 2000 [IU] via INTRAVENOUS
  Administered 2020-10-08: 5000 [IU] via INTRAVENOUS

## 2020-10-08 MED ORDER — PANTOPRAZOLE SODIUM 40 MG PO TBEC: 40.0000 mg | DELAYED_RELEASE_TABLET | Freq: Every day | ORAL | 0 refills | Status: DC

## 2020-10-08 MED ORDER — SUGAMMADEX SODIUM 200 MG/2ML IV SOLN
INTRAVENOUS | Status: DC | PRN
  Administered 2020-10-08: 200 mg via INTRAVENOUS

## 2020-10-08 MED ORDER — ROCURONIUM BROMIDE 10 MG/ML (PF) SYRINGE
PREFILLED_SYRINGE | INTRAVENOUS | Status: DC | PRN
  Administered 2020-10-08: 20 mg via INTRAVENOUS
  Administered 2020-10-08: 80 mg via INTRAVENOUS
  Administered 2020-10-08: 20 mg via INTRAVENOUS

## 2020-10-08 MED ORDER — RIVAROXABAN 20 MG PO TABS
20.0000 mg | ORAL_TABLET | Freq: Every day | ORAL | Status: DC
  Filled 2020-10-08: qty 1

## 2020-10-08 MED ORDER — DEXAMETHASONE SODIUM PHOSPHATE 10 MG/ML IJ SOLN
INTRAMUSCULAR | Status: DC | PRN
  Administered 2020-10-08: 8 mg via INTRAVENOUS

## 2020-10-08 MED ORDER — LIDOCAINE 2% (20 MG/ML) 5 ML SYRINGE
INTRAMUSCULAR | Status: DC | PRN
  Administered 2020-10-08: 100 mg via INTRAVENOUS

## 2020-10-08 MED ORDER — PHENYLEPHRINE HCL-NACL 10-0.9 MG/250ML-% IV SOLN
INTRAVENOUS | Status: DC | PRN
  Administered 2020-10-08: 15 ug/min via INTRAVENOUS

## 2020-10-08 MED ORDER — MIDAZOLAM HCL 2 MG/2ML IJ SOLN
INTRAMUSCULAR | Status: DC | PRN
  Administered 2020-10-08: 2 mg via INTRAVENOUS

## 2020-10-08 MED ORDER — HEPARIN (PORCINE) IN NACL 1000-0.9 UT/500ML-% IV SOLN
INTRAVENOUS | Status: AC
  Filled 2020-10-08: qty 500

## 2020-10-08 MED ORDER — HEPARIN SODIUM (PORCINE) 1000 UNIT/ML IJ SOLN
INTRAMUSCULAR | Status: DC | PRN
  Administered 2020-10-08: 1000 [IU] via INTRAVENOUS
  Administered 2020-10-08: 14000 [IU] via INTRAVENOUS

## 2020-10-08 MED ORDER — ONDANSETRON HCL 4 MG/2ML IJ SOLN
INTRAMUSCULAR | Status: DC | PRN
  Administered 2020-10-08: 4 mg via INTRAVENOUS

## 2020-10-08 MED ORDER — FENTANYL CITRATE (PF) 250 MCG/5ML IJ SOLN
INTRAMUSCULAR | Status: DC | PRN
Start: 2020-10-08 — End: 2020-10-08
  Administered 2020-10-08: 100 ug via INTRAVENOUS

## 2020-10-08 MED ORDER — ISOPROTERENOL HCL 0.2 MG/ML IJ SOLN
INTRAVENOUS | Status: DC | PRN
  Administered 2020-10-08: 20 ug/min via INTRAVENOUS

## 2020-10-08 MED ORDER — PROPOFOL 10 MG/ML IV BOLUS
INTRAVENOUS | Status: DC | PRN
  Administered 2020-10-08: 140 mg via INTRAVENOUS

## 2020-10-08 SURGICAL SUPPLY — 19 items
BLANKET WARM UNDERBOD FULL ACC (MISCELLANEOUS) ×2 IMPLANT
CATH MAPPNG PENTARAY F 2-6-2MM (CATHETERS) IMPLANT
CATH SMTCH THERMOCOOL SF DF (CATHETERS) ×1 IMPLANT
CATH SOUNDSTAR ECO 8FR (CATHETERS) ×1 IMPLANT
CATH WEBSTER BI DIR CS D-F CRV (CATHETERS) ×1 IMPLANT
CLOSURE PERCLOSE PROSTYLE (VASCULAR PRODUCTS) ×3 IMPLANT
COVER SWIFTLINK CONNECTOR (BAG) ×2 IMPLANT
NDL BAYLIS TRANSSEPTAL 71CM (NEEDLE) IMPLANT
NEEDLE BAYLIS TRANSSEPTAL 71CM (NEEDLE) ×2 IMPLANT
PACK EP LATEX FREE (CUSTOM PROCEDURE TRAY) ×2
PACK EP LF (CUSTOM PROCEDURE TRAY) ×1 IMPLANT
PAD PRO RADIOLUCENT 2001M-C (PAD) ×2 IMPLANT
PATCH CARTO3 (PAD) ×1 IMPLANT
PENTARAY F 2-6-2MM (CATHETERS) ×2
SHEATH PINNACLE 7F 10CM (SHEATH) ×2 IMPLANT
SHEATH PINNACLE 9F 10CM (SHEATH) ×1 IMPLANT
SHEATH PROBE COVER 6X72 (BAG) ×2 IMPLANT
SHEATH SWARTZ TS SL2 63CM 8.5F (SHEATH) ×1 IMPLANT
TUBING SMART ABLATE COOLFLOW (TUBING) ×1 IMPLANT

## 2020-10-08 NOTE — Discharge Instructions (Signed)

## 2020-10-08 NOTE — H&P (Signed)
Chief Complaint:  afib  History of Present Illness:    Miguel Stevens is a 64 y.o. male who presents for EP study and ablation of his afib.   He reports being diagnosed with atrial arrhythmias in his 30s.  This was primarily associated with alcohol consumption at that time. He had afib diagnosed with afib on a monitor in 2009.   He has been treated with metoprolol since that time.  He reports symptoms of palpitations on occasion. He had afib on ekg 11/27/2019.  He had an event monitor placed 04/2020 which revealed afib burden of 67%.  His heart rate range is 47-188 bpm.  He is anticoagulated with xarelto. In general, he feels ok.  He is primarily limited by orthopedic issues.  He has occasional fatigue.  He [place]. Today, he denies symptoms of chest pain, shortness of breath, orthopnea, PND, lower extremity edema, claudication, dizziness, presyncope, syncope, bleeding, or neurologic sequela. The patient is tolerating medications without difficulties and is otherwise without complaint today.         Past Medical History:  Diagnosis Date  . Back pain   . GERD (gastroesophageal reflux disease)   . Hypertension   . OSA (obstructive sleep apnea)    not compliant with cpap  . Overweight   . Paroxysmal atrial fibrillation ([facility])    Lone atrial fibrillation by Holter July 2009. This echo demonstrates normal left ventricle size and function. LA size 42 mm          Past Surgical History:  Procedure Laterality Date  . wisdom teeth excised            Current Outpatient Medications  Medication Sig Dispense Refill  . FAMOTIDINE PO Take 20 mg by mouth 2 (two) times daily.     . metoprolol succinate (TOPROL-XL) 100 MG 24 hr tablet Take 1 tablet (100 mg total) by mouth daily. Take with or immediately following a meal. 90 tablet 3  . metroNIDAZOLE (METROGEL) 1 % gel Apply 1 application topically daily.    . Multiple Vitamin  (MULTIVITAMIN) tablet Take 1 tablet by mouth daily.    . rivaroxaban (XARELTO) 20 MG TABS tablet Take 1 tablet (20 mg total) by mouth daily with supper. 90 tablet 3   No current facility-administered medications for this visit.    Allergies:   Patient has no known allergies.   Social History:  The patient  reports that he has never smoked. He has never used smokeless tobacco. He reports current alcohol use. He reports that he does not use drugs.   Family History:  The patient's  family history includes Heart disease in his father and mother.    ROS:  Please see the history of present illness.   All other systems are personally reviewed and negative.   Physical Exam: Vitals:   10/08/20 0849  BP: 133/84  Pulse: (!) 57  Resp: 17  Temp: 98.6 F (37 C)  TempSrc: Oral  SpO2: 98%  Weight: 93 kg  Height: 5\' 10"  (1.778 m)    GEN- The patient is well appearing, alert and oriented x 3 today.   Head- normocephalic, atraumatic Eyes-  Sclera clear, conjunctiva pink Ears- hearing intact Oropharynx- clear Neck- supple, Lungs-   normal work of breathing Heart- irregular rate and rhythm  GI- soft  Extremities- no clubbing, cyanosis, or edema, groin  MS- no significant deformity or atrophy Skin- no rash or lesion Psych- euthymic  mood, full affect Neuro- strength and sensation are intact    Labs/Other Tests and Data Reviewed:    Recent Labs: No results found for requested labs within last 8760 hours.      Wt Readings from Last 3 Encounters:  05/13/20 223 lb (101.2 kg)  04/30/20 223 lb (101.2 kg)  01/29/20 205 lb (93 kg)     Other studies personally reviewed: Additional studies/ records that were reviewed today include: Dr Lonn Georgia notes, recent event monitor, prior echo  Review of the above records today demonstrates: as above   ASSESSMENT & PLAN:    1.  Persistent atrial fibrillation The patient has symptomatic, recurrent paroxysmal atrial fibrillation.   His afib has progressed to persistent.  Chads2vasc score is 1.  he is anticoagulated with xarelto .  Risk, benefits, and alternatives to EP study and radiofrequency ablation for afib were also discussed in detail today. These risks include but are not limited to stroke, bleeding, vascular damage, tamponade, perforation, damage to the esophagus, lungs, and other structures, pulmonary vein stenosis, worsening renal function, and death. The patient understands these risk and wishes to proceed.    Cardiac CT reviewed with him at length.  He reports compliance with xarelto without interruption.  [name] MD, [facility] 10/08/2020 11:15 AM

## 2020-10-08 NOTE — Transfer of Care (Signed)
Immediate Anesthesia Transfer of Care Note  Patient: JANSEN SCIUTO  Procedure(s) Performed: ATRIAL FIBRILLATION ABLATION (N/A )  Patient Location: PACU  Anesthesia Type:General  Level of Consciousness: awake, alert  and oriented  Airway & Oxygen Therapy: Patient Spontanous Breathing and Patient connected to nasal cannula oxygen  Post-op Assessment: Report given to RN and Post -op Vital signs reviewed and stable  Post vital signs: Reviewed and stable  Last Vitals:  Vitals Value Taken Time  BP 119/79 10/08/20 1417  Temp    Pulse 65 10/08/20 1418  Resp 12 10/08/20 1418  SpO2 100 % 10/08/20 1418  Vitals shown include unvalidated device data.  Last Pain:  Vitals:   10/08/20 0849  TempSrc: Oral         Complications: No complications documented.

## 2020-10-08 NOTE — Anesthesia Preprocedure Evaluation (Addendum)
Anesthesia Evaluation  Patient identified by MRN, date of birth, ID band Patient awake    Reviewed: Allergy & Precautions, NPO status , Patient's Chart, lab work & pertinent test results  Airway Mallampati: I  TM Distance: >3 FB Neck ROM: Full    Dental   Pulmonary    Pulmonary exam normal        Cardiovascular METS: hypertension, Pt. on medications Normal cardiovascular exam+ dysrhythmias Atrial Fibrillation      Neuro/Psych    GI/Hepatic GERD  Medicated and Controlled,  Endo/Other    Renal/GU      Musculoskeletal   Abdominal   Peds  Hematology   Anesthesia Other Findings   Reproductive/Obstetrics                            Anesthesia Physical Anesthesia Plan  ASA: III  Anesthesia Plan: General   Post-op Pain Management:    Induction: Intravenous  PONV Risk Score and Plan: 1 and Ondansetron and Treatment may vary due to age or medical condition  Airway Management Planned: LMA  Additional Equipment:   Intra-op Plan:   Post-operative Plan:   Informed Consent: I have reviewed the patients History and Physical, chart, labs and discussed the procedure including the risks, benefits and alternatives for the proposed anesthesia with the patient or authorized representative who has indicated his/her understanding and acceptance.       Plan Discussed with: CRNA and Surgeon  Anesthesia Plan Comments:        Anesthesia Quick Evaluation

## 2020-10-08 NOTE — Anesthesia Postprocedure Evaluation (Signed)
Anesthesia Post Note  Patient: Miguel Stevens  Procedure(s) Performed: ATRIAL FIBRILLATION ABLATION (N/A )     Patient location during evaluation: PACU Anesthesia Type: General Level of consciousness: awake and alert Pain management: pain level controlled Vital Signs Assessment: post-procedure vital signs reviewed and stable Respiratory status: spontaneous breathing, nonlabored ventilation, respiratory function stable and patient connected to nasal cannula oxygen Cardiovascular status: blood pressure returned to baseline and stable Postop Assessment: no apparent nausea or vomiting Anesthetic complications: no   No complications documented.  Last Vitals:  Vitals:   10/08/20 1430 10/08/20 1445  BP: 126/78 121/76  Pulse: 65 65  Resp: 14 12  Temp: 36.4 C   SpO2: 100% 100%    Last Pain:  Vitals:   10/08/20 1430  TempSrc: Temporal  PainSc: 0-No pain                 Earl Lites P Francesca Strome

## 2020-10-08 NOTE — Anesthesia Procedure Notes (Addendum)
Procedure Name: Intubation Date/Time: 10/08/2020 11:46 AM Performed by: Dorthea Cove, CRNA Pre-anesthesia Checklist: Patient identified, Emergency Drugs available, Suction available and Patient being monitored Patient Re-evaluated:Patient Re-evaluated prior to induction Oxygen Delivery Method: Circle system utilized Preoxygenation: Pre-oxygenation with 100% oxygen Induction Type: IV induction Ventilation: Two handed mask ventilation required, Oral airway inserted - appropriate to patient size and Mask ventilation with difficulty Laryngoscope Size: Mac and 4 Grade View: Grade II Tube type: Oral Tube size: 7.5 mm Number of attempts: 1 Airway Equipment and Method: Stylet and Oral airway Placement Confirmation: ETT inserted through vocal cords under direct vision,  positive ETCO2 and breath sounds checked- equal and bilateral Secured at: 21 cm Tube secured with: Tape Dental Injury: Teeth and Oropharynx as per pre-operative assessment

## 2020-10-09 ENCOUNTER — Encounter (HOSPITAL_COMMUNITY): Payer: Self-pay | Admitting: Internal Medicine

## 2020-10-09 LAB — POCT ACTIVATED CLOTTING TIME
Activated Clotting Time: 274 seconds
Activated Clotting Time: 323 seconds

## 2020-10-11 ENCOUNTER — Telehealth: Payer: Self-pay | Admitting: *Deleted

## 2020-10-11 DIAGNOSIS — G4733 Obstructive sleep apnea (adult) (pediatric): Secondary | ICD-10-CM

## 2020-10-11 NOTE — Telephone Encounter (Signed)
-----   Message from Quintella Reichert, MD sent at 10/10/2020 12:11 PM EDT ----- Regarding: RE: ADVISE Please change CPAP to auto PAP from 4 to 20cm H2O and get a download in 2 weeks  What do you mean no seal?  Is his mask leaking?  Traci ----- Message ----- From: Reesa Chew, CMA Sent: 10/09/2020   5:28 PM EDT To: Quintella Reichert, MD Subject: ADVISE                                         Patient complains of pressure being too high and he is not sleeping good. He reports he has no seal. Victorino Dike at [facility] ask if you would write a Rx for auto pressure.  Please advise Thanks, nina

## 2020-10-11 NOTE — Telephone Encounter (Addendum)
When he sleeps on his side which is how he sleeps all the time the mask will not stay sealed.  Order faxed to [facility].

## 2020-10-11 NOTE — Telephone Encounter (Signed)
He needs an appt with DME to discuss this

## 2020-10-15 NOTE — Telephone Encounter (Signed)
Fax sent to dme to contact patient about his problem with is mask seal.

## 2020-11-05 ENCOUNTER — Other Ambulatory Visit: Payer: Self-pay

## 2020-11-05 ENCOUNTER — Encounter (HOSPITAL_COMMUNITY): Payer: Self-pay | Admitting: Nurse Practitioner

## 2020-11-05 ENCOUNTER — Ambulatory Visit (HOSPITAL_COMMUNITY)
Admission: RE | Admit: 2020-11-05 | Discharge: 2020-11-05 | Disposition: A | Discharge status: Home / Self Care | Payer: BC Managed Care – PPO | Source: Ambulatory Visit | Attending: Nurse Practitioner | Admitting: Nurse Practitioner

## 2020-11-05 VITALS — BP 136/84 | HR 61 | Ht 70.0 in | Wt 223.0 lb

## 2020-11-05 DIAGNOSIS — G4733 Obstructive sleep apnea (adult) (pediatric): Secondary | ICD-10-CM | POA: Insufficient documentation

## 2020-11-05 DIAGNOSIS — I480 Paroxysmal atrial fibrillation: Secondary | ICD-10-CM | POA: Diagnosis present

## 2020-11-05 DIAGNOSIS — I10 Essential (primary) hypertension: Secondary | ICD-10-CM | POA: Diagnosis not present

## 2020-11-05 DIAGNOSIS — Z79899 Other long term (current) drug therapy: Secondary | ICD-10-CM | POA: Insufficient documentation

## 2020-11-05 DIAGNOSIS — Z9119 Patient's noncompliance with other medical treatment and regimen: Secondary | ICD-10-CM | POA: Insufficient documentation

## 2020-11-05 DIAGNOSIS — D6869 Other thrombophilia: Secondary | ICD-10-CM

## 2020-11-05 DIAGNOSIS — Z7901 Long term (current) use of anticoagulants: Secondary | ICD-10-CM | POA: Insufficient documentation

## 2020-11-05 NOTE — Progress Notes (Signed)
Primary Care Physician: Shirlean Mylar, MD Referring Physician:Dr. Johney Frame    Miguel Stevens is a 64 y.o. male with a h/o HTN, paroxysmal afib, that is here to f/u on afib ablation one month ago. No awareness of significant afib.  No groin or swallowing issues. He has resumed his normal activities. He started use of CPAP 6 weeks ago but feel that it is interfering with his sleep. He has a video visit with Dr. Mayford Knife 11/23.  On xarelto with a CHA2DS2VASc score of 1.   Today, he denies symptoms of palpitations, chest pain, shortness of breath, orthopnea, PND, lower extremity edema, dizziness, presyncope, syncope, or neurologic sequela. The patient is tolerating medications without difficulties and is otherwise without complaint today.   Past Medical History:  Diagnosis Date  . Back pain   . GERD (gastroesophageal reflux disease)   . Hypertension   . OSA (obstructive sleep apnea)    not compliant with cpap  . Overweight   . Paroxysmal atrial fibrillation (HCC)    Lone atrial fibrillation by Holter July 2009. This echo demonstrates normal left ventricle size and function. LA size 42 mm    Past Surgical History:  Procedure Laterality Date  . ATRIAL FIBRILLATION ABLATION N/A 10/08/2020   Procedure: ATRIAL FIBRILLATION ABLATION;  Surgeon: [name], MD;  [facility];  Service: Cardiovascular;  Laterality: N/A;  . wisdom teeth excised      Current Outpatient Medications  Medication Sig Dispense Refill  . FAMOTIDINE PO Take 2 tablets by mouth daily with supper.     . metoprolol succinate (TOPROL-XL) 100 MG 24 hr tablet Take 1 tablet (100 mg total) by mouth daily. Take with or immediately following a meal. (Patient taking differently: Take 100 mg by mouth daily with supper. Take with or immediately following a meal.) 90 tablet 3  . metroNIDAZOLE (METROGEL) 1 % gel Apply 1 application topically at bedtime.     . Multiple Vitamin (MULTIVITAMIN) tablet Take 1 tablet by mouth  daily. Men 50 +    . pantoprazole (PROTONIX) 40 MG tablet Take 1 tablet (40 mg total) by mouth daily. 45 tablet 0  . rivaroxaban (XARELTO) 20 MG TABS tablet Take 1 tablet (20 mg total) by mouth daily with supper. 90 tablet 3   No current facility-administered medications for this encounter.    No Known Allergies  Social History   Socioeconomic History  . Marital status: Married    Spouse name: Not on file  . Number of children: Not on file  . Years of education: Not on file  . Highest education level: Not on file  Occupational History  . Not on file  Tobacco Use  . Smoking status: Never Smoker  . Smokeless tobacco: Never Used  Vaping Use  . Vaping Use: Never used  Substance and Sexual Activity  . Alcohol use: Yes    Alcohol/week: 0.0 standard drinks    Comment: an occasionally beer .  Marland Kitchen Drug use: No  . Sexual activity: Not on file  Other Topics Concern  . Not on file  Social History Narrative   Former Oceanographer professor (now retired)    lives in [place]   He now works as a Research scientist (medical).     Social Determinants of Health   Financial Resource Strain:   . Difficulty of Paying Living Expenses: Not on file  Food Insecurity:   . Worried About Programme researcher, broadcasting/film/video in the Last Year: Not on file  .  Ran Out of Food in the Last Year: Not on file  Transportation Needs:   . Lack of Transportation (Medical): Not on file  . Lack of Transportation (Non-Medical): Not on file  Physical Activity:   . Days of Exercise per Week: Not on file  . Minutes of Exercise per Session: Not on file  Stress:   . Feeling of Stress : Not on file  Social Connections:   . Frequency of Communication with Friends and Family: Not on file  . Frequency of Social Gatherings with Friends and Family: Not on file  . Attends Religious Services: Not on file  . Active Member of Clubs or Organizations: Not on file  . Attends Banker Meetings: Not on file  . Marital Status: Not on file    Intimate Partner Violence:   . Fear of Current or Ex-Partner: Not on file  . Emotionally Abused: Not on file  . Physically Abused: Not on file  . Sexually Abused: Not on file    Family History  Problem Relation Age of Onset  . Heart disease Mother   . Heart disease Father     ROS- All systems are reviewed and negative except as per the HPI above  Physical Exam: There were no vitals filed for this visit. Wt Readings from Last 3 Encounters:  10/08/20 93 kg  09/15/20 95.3 kg  05/13/20 101.2 kg    Labs: Lab Results  Component Value Date   NA 143 09/19/2020   K 4.3 09/19/2020   CL 106 09/19/2020   CO2 27 09/19/2020   GLUCOSE 96 09/19/2020   BUN 17 09/19/2020   CREATININE 1.31 (H) 09/19/2020   CALCIUM 9.2 09/19/2020   No results found for: INR No results found for: CHOL, HDL, LDLCALC, TRIG   GEN- The patient is well appearing, alert and oriented x 3 today.   Head- normocephalic, atraumatic Eyes-  Sclera clear, conjunctiva pink Ears- hearing intact Oropharynx- clear Neck- supple, no JVP Lymph- no cervical lymphadenopathy Lungs- Clear to ausculation bilaterally, normal work of breathing Heart- Regular rate and rhythm, no murmurs, rubs or gallops, PMI not laterally displaced GI- soft, NT, ND, + BS Extremities- no clubbing, cyanosis, or edema MS- no significant deformity or atrophy Skin- no rash or lesion Psych- euthymic mood, full affect Neuro- strength and sensation are intact  EKG-NSR at 61 bpm, normal ekg Epic records reviewed    Assessment and Plan: 1. Paroxysmal afib  S/p ablation one month ago and is doing well staying in SR Continue BB as prescribed   2. CHA2DS2VASc score of 1 Continue  xarelto during the healing process for the next 2 months without interruption   3. HTN Stable  4. OSA On CPAP Per Dr. Hilaria Ota [facility] [address] [place], [place] [address] [phone]

## 2020-11-12 ENCOUNTER — Encounter: Payer: Self-pay | Admitting: Cardiology

## 2020-11-12 ENCOUNTER — Other Ambulatory Visit: Payer: Self-pay

## 2020-11-12 ENCOUNTER — Telehealth (INDEPENDENT_AMBULATORY_CARE_PROVIDER_SITE_OTHER): Payer: BC Managed Care – PPO | Admitting: Cardiology

## 2020-11-12 VITALS — Ht 70.0 in | Wt 220.0 lb

## 2020-11-12 DIAGNOSIS — E669 Obesity, unspecified: Secondary | ICD-10-CM | POA: Diagnosis not present

## 2020-11-12 DIAGNOSIS — I10 Essential (primary) hypertension: Secondary | ICD-10-CM | POA: Diagnosis not present

## 2020-11-12 DIAGNOSIS — G4733 Obstructive sleep apnea (adult) (pediatric): Secondary | ICD-10-CM | POA: Diagnosis not present

## 2020-11-12 NOTE — Progress Notes (Signed)
Virtual Visit via Telephone Note   This visit type was conducted due to national recommendations for restrictions regarding the COVID-19 Pandemic (e.g. social distancing) in an effort to limit this patient's exposure and mitigate transmission in our community.  Due to his co-morbid illnesses, this patient is at least at moderate risk for complications without adequate follow up.  This format is felt to be most appropriate for this patient at this time.  All issues noted in this document were discussed and addressed.  A limited physical exam was performed with this format.  Please refer to the patient's chart for his consent to telehealth for [facility].   Evaluation Performed:  Follow-up visit  This visit type was conducted due to national recommendations for restrictions regarding the COVID-19 Pandemic (e.g. social distancing).  This format is felt to be most appropriate for this patient at this time.  All issues noted in this document were discussed and addressed.  No physical exam was performed (except for noted visual exam findings with Video Visits).  Please refer to the patient's chart (MyChart message for video visits and phone note for telephone visits) for the patient's consent to telehealth for [facility].  Date:  11/12/2020   ID:  Miguel Stevens, DOB 1956/07/27, MRN [id number]  Patient Location:  Home  Provider location:   [place]  PCP:  Shirlean Mylar, MD  Cardiologist:  Lesleigh Noe, MD  Sleep Medicine:  Armanda Magic, MD Electrophysiologist:  None   Chief Complaint:  OSA  History of Present Illness:    Miguel Stevens is a 64 y.o. male who presents via audio/video conferencing for a telehealth visit today.    This is a 64yo male with a hx of PAF, GERD and HTN who was referred by Dr. Katrinka Blazing for sleep evaluation.  He was seen by Dr. Katrinka Blazing and mentioned that he snores but did not have excessive daytime sleepiness.  Due to hx of PAF a sleep study was recommended.   He underwent PSG which showed moderate OSA with an AHI of 16.5/hr and O2 sats as low as 81% with nocturnal hypoxemia.  CPAP titration was recommended but before proceeding with CPAP he wanted an OV to discuss his treatment options.    At last OV he decided to go ahead and proceed with CPAP last February but he said that he did not start until a few months ago because he did not want to use it but started about 2 months ago.  He had a lot of problems with his mask leaking and talked with the DME multiple times about what to do with the mask.  Unfortunately he has a mask and cannot get a good seal on his mask.  He is also frustrated with the fact that he is waking up every morning around 5:30am ever since using the CPAP and then cannot get back to sleep. He has not really noticed that he feels any less sleepy during the day since starting on CPAP therapy.  He also admits to sleeping supine at night because his mask leaks in any other position.   The patient does not have symptoms concerning for COVID-19 infection (fever, chills, cough, or new shortness of breath).   Prior CV studies:   The following studies were reviewed today:  Sleep study, OV notes from Dr. Katrinka Blazing  Past Medical History:  Diagnosis Date  . Back pain   . GERD (gastroesophageal reflux disease)   . Hypertension   .  OSA (obstructive sleep apnea)    not compliant with cpap  . Overweight   . Paroxysmal atrial fibrillation (HCC)    Lone atrial fibrillation by Holter July 2009. This echo demonstrates normal left ventricle size and function. LA size 42 mm    Past Surgical History:  Procedure Laterality Date  . ATRIAL FIBRILLATION ABLATION N/A 10/08/2020   Procedure: ATRIAL FIBRILLATION ABLATION;  Surgeon: [name], MD;  [facility];  Service: Cardiovascular;  Laterality: N/A;  . wisdom teeth excised       Current Meds  Medication Sig  . FAMOTIDINE PO Take 2 tablets by mouth daily with supper.   . metoprolol  succinate (TOPROL-XL) 100 MG 24 hr tablet Take 1 tablet (100 mg total) by mouth daily. Take with or immediately following a meal. (Patient taking differently: Take 100 mg by mouth daily with supper. Take with or immediately following a meal.)  . metroNIDAZOLE (METROGEL) 1 % gel Apply 1 application topically at bedtime.   . Multiple Vitamin (MULTIVITAMIN) tablet Take 1 tablet by mouth daily. Men 50 +  . pantoprazole (PROTONIX) 40 MG tablet Take 1 tablet (40 mg total) by mouth daily.  . rivaroxaban (XARELTO) 20 MG TABS tablet Take 1 tablet (20 mg total) by mouth daily with supper.     Allergies:   Patient has no known allergies.   Social History   Tobacco Use  . Smoking status: Never Smoker  . Smokeless tobacco: Never Used  Vaping Use  . Vaping Use: Never used  Substance Use Topics  . Alcohol use: Yes    Alcohol/week: 0.0 standard drinks    Comment: an occasionally beer .  Marland Kitchen Drug use: No     Family Hx: The patient's family history includes Heart disease in his father and mother.  ROS:   Please see the history of present illness.     All other systems reviewed and are negative.   Labs/Other Tests and Data Reviewed:    Recent Labs: 09/19/2020: BUN 17; Creatinine, Ser 1.31; Hemoglobin 16.4; Platelets 197; Potassium 4.3; Sodium 143   Recent Lipid Panel No results found for: CHOL, TRIG, HDL, CHOLHDL, LDLCALC, LDLDIRECT  Wt Readings from Last 3 Encounters:  11/12/20 220 lb (99.8 kg)  11/05/20 223 lb (101.2 kg)  10/08/20 205 lb (93 kg)     Objective:    Vital Signs:  Ht 5\' 10"  (1.778 m)   Wt 220 lb (99.8 kg)   BMI 31.57 kg/m     ASSESSMENT & PLAN:    1.  OSA - The pathophysiology of obstructive sleep apnea , it's cardiovascular consequences & modes of treatment including CPAP were discused with the patient in detail & they evidenced understanding.  The patient is tolerating PAP therapy well without any problems. The PAP download was reviewed today and showed an AHI of  11.7/hr on auto PAP  with 100% compliance in using more than 4 hours nightly.  The patient has been using and benefiting from PAP use and will continue to benefit from therapy.  -I think part of the problem is that his mask is leaking due to having a beard resulting in improper seal -I will order him a nasal pillow mask with a chin strap to see if that will help -I will get a repeat download in 4 weeks -I encouraged him to try to avoid sleeping supine -followup with me in 8 weeks  2.  HTN -continue Toprol XL 50mg  daily  3.  Obesity -  I have encouraged him to get into a routine exercise program and cut back on carbs and portions.   COVID-19 Education: The signs and symptoms of COVID-19 were discussed with the patient and how to seek care for testing (follow up with PCP or arrange E-visit).  The importance of social distancing was discussed today.  Patient Risk:   After full review of this patient's clinical status, I feel that they are at least moderate risk at this time.  Time:   Today, I have spent 20 minutes on telemedicine discussing medical problems including OSA and reviewing sleep study and PAP compliance download.  Medication Adjustments/Labs and Tests Ordered: Current medicines are reviewed at length with the patient today.  Concerns regarding medicines are outlined above.  Tests Ordered: No orders of the defined types were placed in this encounter.  Medication Changes: No orders of the defined types were placed in this encounter.   Disposition:  Follow up in 10 week(s)  Signed, Armanda Magic, MD  11/12/2020 8:23 AM    [facility]

## 2020-11-14 ENCOUNTER — Other Ambulatory Visit: Payer: Self-pay | Admitting: Internal Medicine

## 2020-11-21 ENCOUNTER — Telehealth: Payer: Self-pay | Admitting: *Deleted

## 2020-11-21 DIAGNOSIS — G4733 Obstructive sleep apnea (adult) (pediatric): Secondary | ICD-10-CM

## 2020-11-21 NOTE — Telephone Encounter (Signed)
-----   Message from Quintella Reichert, MD sent at 11/12/2020  8:23 AM EST ----- Please order a nasal pillow mask with chin strap and get a download in 4 weeks and followup with me in 8 weeks

## 2020-11-21 NOTE — Telephone Encounter (Signed)
[facility] to [facility] and reminder made.

## 2021-01-10 ENCOUNTER — Ambulatory Visit (INDEPENDENT_AMBULATORY_CARE_PROVIDER_SITE_OTHER): Payer: BC Managed Care – PPO | Admitting: Internal Medicine

## 2021-01-10 ENCOUNTER — Encounter: Payer: Self-pay | Admitting: Internal Medicine

## 2021-01-10 ENCOUNTER — Other Ambulatory Visit: Payer: Self-pay

## 2021-01-10 VITALS — BP 140/92 | HR 62 | Ht 70.0 in | Wt 223.2 lb

## 2021-01-10 DIAGNOSIS — G4733 Obstructive sleep apnea (adult) (pediatric): Secondary | ICD-10-CM | POA: Diagnosis not present

## 2021-01-10 DIAGNOSIS — I10 Essential (primary) hypertension: Secondary | ICD-10-CM | POA: Diagnosis not present

## 2021-01-10 DIAGNOSIS — I480 Paroxysmal atrial fibrillation: Secondary | ICD-10-CM

## 2021-01-10 NOTE — Patient Instructions (Signed)
Medication Instructions:  none *If you need a refill on your cardiac medications before your next appointment, please call your pharmacy*   Lab Work: none If you have labs (blood work) drawn today and your tests are completely normal, you will receive your results only by: Marland Kitchen MyChart Message (if you have MyChart) OR . A paper copy in the mail If you have any lab test that is abnormal or we need to change your treatment, we will call you to review the results.   Testing/Procedures: none   Follow-Up: At [facility], you and your health needs are our priority.  As part of our continuing mission to provide you with exceptional heart care, we have created designated Provider Care Teams.  These Care Teams include your primary Cardiologist (physician) and Advanced Practice Providers (APPs -  Physician Assistants and Nurse Practitioners) who all work together to provide you with the care you need, when you need it.  We recommend signing up for the patient portal called "MyChart".  Sign up information is provided on this After Visit Summary.  MyChart is used to connect with patients for Virtual Visits (Telemedicine).  Patients are able to view lab/test results, encounter notes, upcoming appointments, etc.  Non-urgent messages can be sent to your provider as well.   To learn more about what you can do with MyChart, go to [email or web address].    Your next appointment:   3 month(s)  The format for your next appointment:   In Person  Provider:   [name], MD   Other Instructions

## 2021-01-10 NOTE — Progress Notes (Signed)
   PCP: Shirlean Mylar, MD Primary Cardiologist: Dr Benjamine Mola is a 65 y.o. male who presents today for routine electrophysiology followup.  Since his recent afib ablation, the patient reports doing very well.  he denies procedure related complications and is pleased with the results of the procedure.  Very rare palpitations.  Today, he denies symptoms of chest pain, shortness of breath,  lower extremity edema, dizziness, presyncope, or syncope.  The patient is otherwise without complaint today.   Past Medical History:  Diagnosis Date  . Back pain   . GERD (gastroesophageal reflux disease)   . Hypertension   . OSA (obstructive sleep apnea)    not compliant with cpap  . Overweight   . Paroxysmal atrial fibrillation (HCC)    Lone atrial fibrillation by Holter July 2009. This echo demonstrates normal left ventricle size and function. LA size 42 mm    Past Surgical History:  Procedure Laterality Date  . ATRIAL FIBRILLATION ABLATION N/A 10/08/2020   Procedure: ATRIAL FIBRILLATION ABLATION;  Surgeon: [name], MD;  [facility]: [facility];  Service: Cardiovascular;  Laterality: N/A;  . wisdom teeth excised      ROS- all systems are personally reviewed and negatives except as per HPI above  Current Outpatient Medications  Medication Sig Dispense Refill  . FAMOTIDINE PO Take 2 tablets by mouth daily with supper.     . metoprolol succinate (TOPROL-XL) 100 MG 24 hr tablet Take 1 tablet (100 mg total) by mouth daily. Take with or immediately following a meal. 90 tablet 3  . metroNIDAZOLE (METROGEL) 1 % gel Apply 1 application topically at bedtime.     . Multiple Vitamin (MULTIVITAMIN) tablet Take 1 tablet by mouth daily. Men 50 +    . rivaroxaban (XARELTO) 20 MG TABS tablet Take 1 tablet (20 mg total) by mouth daily with supper. 90 tablet 3   No current facility-administered medications for this visit.    Physical Exam: Vitals:   01/10/21 1049  BP: (!) 140/92  Pulse:  62  SpO2: 95%  Weight: 223 lb 3.2 oz (101.2 kg)  Height: 5\' 10"  (1.778 m)    GEN- The patient is well appearing, alert and oriented x 3 today.   Head- normocephalic, atraumatic Eyes-  Sclera clear, conjunctiva pink Ears- hearing intact Oropharynx- clear Lungs- Clear to ausculation bilaterally, normal work of breathing Heart- Regular rate and rhythm, no murmurs, rubs or gallops, PMI not laterally displaced GI- soft, NT, ND, + BS Extremities- no clubbing, cyanosis, or edema  EKG tracing ordered today is personally reviewed and shows sinus rhythm  Assessment and Plan:  1. Paroxysmal atrial fibrillation Doing well s/p ablation chads2vasc score is 1 We discussed pros and cons of OAC therapy based on his chads2vasc score.  For now, he would prefer to continue xarelto. We also discussed the possibility of reducing toprol to 50mg  daily He is not ready to do this currently but will think about it prior to return  2. OSA Compliance with CPAP advised  3. HTN Stable No change required today  4. oveweight Lifestyle modification advised   Return to see me in 3 months  MD, [facility] 01/10/2021 11:07 AM

## 2021-01-28 ENCOUNTER — Telehealth (INDEPENDENT_AMBULATORY_CARE_PROVIDER_SITE_OTHER): Payer: BC Managed Care – PPO | Admitting: Cardiology

## 2021-01-28 ENCOUNTER — Encounter: Payer: Self-pay | Admitting: Cardiology

## 2021-01-28 ENCOUNTER — Telehealth: Payer: Self-pay | Admitting: *Deleted

## 2021-01-28 ENCOUNTER — Other Ambulatory Visit: Payer: Self-pay

## 2021-01-28 VITALS — HR 72 | Ht 70.0 in | Wt 220.0 lb

## 2021-01-28 DIAGNOSIS — G4733 Obstructive sleep apnea (adult) (pediatric): Secondary | ICD-10-CM | POA: Diagnosis not present

## 2021-01-28 DIAGNOSIS — I10 Essential (primary) hypertension: Secondary | ICD-10-CM

## 2021-01-28 DIAGNOSIS — E669 Obesity, unspecified: Secondary | ICD-10-CM

## 2021-01-28 NOTE — Progress Notes (Signed)
Virtual Visit via Video Note   This visit type was conducted due to national recommendations for restrictions regarding the COVID-19 Pandemic (e.g. social distancing) in an effort to limit this patient's exposure and mitigate transmission in our community.  Due to his co-morbid illnesses, this patient is at least at moderate risk for complications without adequate follow up.  This format is felt to be most appropriate for this patient at this time.  All issues noted in this document were discussed and addressed.  A limited physical exam was performed with this format.  Please refer to the patient's chart for his consent to telehealth for [facility].   Evaluation Performed:  Follow-up visit  This visit type was conducted due to national recommendations for restrictions regarding the COVID-19 Pandemic (e.g. social distancing).  This format is felt to be most appropriate for this patient at this time.  All issues noted in this document were discussed and addressed.  No physical exam was performed (except for noted visual exam findings with Video Visits).  Please refer to the patient's chart (MyChart message for video visits and phone note for telephone visits) for the patient's consent to telehealth for [facility].  Date:  01/28/2021   ID:  Miguel Stevens, DOB 26-May-1956, MRN [id number]  Patient Location:  Home  Provider location:   [place]  PCP:  Shirlean Mylar, MD  Cardiologist:  Lesleigh Noe, MD  Sleep Medicine:  Armanda Magic, MD Electrophysiologist:  None   Chief Complaint:  OSA  History of Present Illness:    Miguel Stevens is a 65 y.o. male who presents via audio/video conferencing for a telehealth visit today.    This is a 65yo male with a hx of PAF, GERD and HTN who was referred by Dr. Katrinka Blazing for sleep evaluation.  He was seen by Dr. Katrinka Blazing and mentioned that he snores but did not have excessive daytime sleepiness.  Due to hx of PAF a sleep study was recommended.  He  underwent PSG which showed moderate OSA with an AHI of 16.5/hr and O2 sats as low as 81% with nocturnal hypoxemia.  CPAP titration was recommended but before proceeding with CPAP he wanted an OV to discuss his treatment options.    He has had problems with his mask leaking and at last OV his AHI was elevated at 11/hr.  I ordered a nasal mask and chin strap and now he is back for followup. Apparently his insurance required that he wait 3 months from when he started PAP before he could get a new mask which would be January.  He was contacted by his DME in January but cannot remember what the takeaway from the call but he has not heard back and it has been over 3 weeks.  He is still using the old mask which still leaks but thinks it may be improved some.  He still has problems getting a full night sleep due to working constraints.  He usually get around 6 hours of sleep.  He thinks it is getting better in being able to sleep at least 6 hours a night using the mask the entire time.  He sometimes will take off the mask in his sleep but that has been less and less as time goes on and he has been able to keep the mask on through the night for the most part.  He is still frustrated with trying to get a good fit around the nose.  The patient does not have symptoms concerning for COVID-19 infection (fever, chills, cough, or new shortness of breath).   Prior CV studies:   The following studies were reviewed today:  Sleep study, OV notes from Dr. Katrinka Blazing  Past Medical History:  Diagnosis Date  . Back pain   . GERD (gastroesophageal reflux disease)   . Hypertension   . OSA (obstructive sleep apnea)    not compliant with cpap  . Overweight   . Paroxysmal atrial fibrillation (HCC)    Lone atrial fibrillation by Holter July 2009. This echo demonstrates normal left ventricle size and function. LA size 42 mm    Past Surgical History:  Procedure Laterality Date  . ATRIAL FIBRILLATION ABLATION N/A 10/08/2020    Procedure: ATRIAL FIBRILLATION ABLATION;  Surgeon: [name], MD;  [facility];  Service: Cardiovascular;  Laterality: N/A;  . wisdom teeth excised       Current Meds  Medication Sig  . FAMOTIDINE PO Take 2 tablets by mouth daily with supper.   . metoprolol succinate (TOPROL-XL) 100 MG 24 hr tablet Take 1 tablet (100 mg total) by mouth daily. Take with or immediately following a meal.  . metroNIDAZOLE (METROGEL) 1 % gel Apply 1 application topically at bedtime.   . Multiple Vitamin (MULTIVITAMIN) tablet Take 1 tablet by mouth daily. Men 50 +  . rivaroxaban (XARELTO) 20 MG TABS tablet Take 1 tablet (20 mg total) by mouth daily with supper.     Allergies:   Patient has no known allergies.   Social History   Tobacco Use  . Smoking status: Never Smoker  . Smokeless tobacco: Never Used  Vaping Use  . Vaping Use: Never used  Substance Use Topics  . Alcohol use: Yes    Alcohol/week: 0.0 standard drinks    Comment: an occasionally beer .  Marland Kitchen Drug use: No     Family Hx: The patient's family history includes Heart disease in his father and mother.  ROS:   Please see the history of present illness.     All other systems reviewed and are negative.   Labs/Other Tests and Data Reviewed:    Recent Labs: 09/19/2020: BUN 17; Creatinine, Ser 1.31; Hemoglobin 16.4; Platelets 197; Potassium 4.3; Sodium 143   Recent Lipid Panel No results found for: CHOL, TRIG, HDL, CHOLHDL, LDLCALC, LDLDIRECT  Wt Readings from Last 3 Encounters:  01/28/21 220 lb (99.8 kg)  01/10/21 223 lb 3.2 oz (101.2 kg)  11/12/20 220 lb (99.8 kg)     Objective:    Vital Signs:  Pulse 72   Ht 5\' 10"  (1.778 m)   Wt 220 lb (99.8 kg)   BMI 31.57 kg/m    Well nourished, well developed male in no acute distress. Well appearing, alert and conversant, regular work of breathing,  good skin color  Eyes- anicteric mouth- oral mucosa is pink  neuro- grossly intact skin- no apparent rash or  lesions or cyanosis   ASSESSMENT & PLAN:    1.  OSA -The patient is tolerating PAP therapy well without any problems. The PAP download was reviewed today and showed an AHI of 6.9/hr on auto PAP with 97% compliance in using more than 4 hours nightly.  The patient has been using and benefiting from PAP use and will continue to benefit from therapy.  -I will check with his DME on how to get his new nasal mask  2.  HTN -continue Toprol XL 50mg  daily  3.  Obesity -  I have encouraged him to get into a routine exercise program and cut back on carbs and portions.   COVID-19 Education: The signs and symptoms of COVID-19 were discussed with the patient and how to seek care for testing (follow up with PCP or arrange E-visit).  The importance of social distancing was discussed today.  Patient Risk:   After full review of this patient's clinical status, I feel that they are at least moderate risk at this time.  Time:   Today, I have spent 20 minutes on telemedicine discussing medical problems including OSA and reviewing sleep study and PAP compliance download.  Medication Adjustments/Labs and Tests Ordered: Current medicines are reviewed at length with the patient today.  Concerns regarding medicines are outlined above.  Tests Ordered: No orders of the defined types were placed in this encounter.  Medication Changes: No orders of the defined types were placed in this encounter.   Disposition:  Follow up in 8 weeks virtually  Signed, Armanda Magic, MD  01/28/2021 2:15 PM    [facility]

## 2021-01-28 NOTE — Patient Instructions (Signed)
Medication Instructions:  °Your physician recommends that you continue on your current medications as directed. Please refer to the Current Medication list given to you today.  °*If you need a refill on your cardiac medications before your next appointment, please call your pharmacy* ° °Follow-Up: °At [facility], you and your health needs are our priority.  As part of our continuing mission to provide you with exceptional heart care, we have created designated Provider Care Teams.  These Care Teams include your primary Cardiologist (physician) and Advanced Practice Providers (APPs -  Physician Assistants and Nurse Practitioners) who all work together to provide you with the care you need, when you need it. ° °Your next appointment:   °8 week(s) ° °The format for your next appointment:   °Virtual Visit  ° °Provider:   °Traci Turner, MD °

## 2021-01-28 NOTE — Telephone Encounter (Signed)
-----   Message from Quintella Reichert, MD sent at 01/28/2021  2:25 PM EST ----- Please find out what is going on with his DME about his new mask.

## 2021-01-28 NOTE — Telephone Encounter (Signed)
Insurance will not pay for the headgear until April (every 6 mos) and he needs a new mask when you get new headgear. He is eligible to get his new mask now. Patient will call back after speaking to his dme.

## 2021-01-30 ENCOUNTER — Other Ambulatory Visit: Payer: Self-pay | Admitting: Family Medicine

## 2021-01-30 DIAGNOSIS — R7989 Other specified abnormal findings of blood chemistry: Secondary | ICD-10-CM

## 2021-02-13 ENCOUNTER — Ambulatory Visit
Admission: RE | Admit: 2021-02-13 | Discharge: 2021-02-13 | Disposition: A | Discharge status: Home / Self Care | Payer: BC Managed Care – PPO | Source: Ambulatory Visit | Attending: Family Medicine | Admitting: Family Medicine

## 2021-02-13 DIAGNOSIS — R7989 Other specified abnormal findings of blood chemistry: Secondary | ICD-10-CM

## 2021-02-18 ENCOUNTER — Other Ambulatory Visit: Payer: Self-pay | Admitting: Family Medicine

## 2021-02-18 DIAGNOSIS — K838 Other specified diseases of biliary tract: Secondary | ICD-10-CM

## 2021-03-07 ENCOUNTER — Ambulatory Visit
Admission: RE | Admit: 2021-03-07 | Discharge: 2021-03-07 | Disposition: A | Discharge status: Home / Self Care | Payer: BC Managed Care – PPO | Source: Ambulatory Visit | Attending: Family Medicine | Admitting: Family Medicine

## 2021-03-07 DIAGNOSIS — K838 Other specified diseases of biliary tract: Secondary | ICD-10-CM

## 2021-03-07 MED ORDER — GADOBENATE DIMEGLUMINE 529 MG/ML IV SOLN
20.0000 mL | Freq: Once | INTRAVENOUS | Status: AC | PRN
  Administered 2021-03-07: 20 mL via INTRAVENOUS

## 2021-03-24 NOTE — Telephone Encounter (Signed)
Spoke with the patient and rescheduled his appointment

## 2021-03-27 ENCOUNTER — Telehealth: Payer: BC Managed Care – PPO | Admitting: Cardiology

## 2021-04-14 ENCOUNTER — Other Ambulatory Visit: Payer: Self-pay

## 2021-04-14 ENCOUNTER — Ambulatory Visit: Payer: BC Managed Care – PPO | Admitting: Internal Medicine

## 2021-04-14 ENCOUNTER — Encounter: Payer: Self-pay | Admitting: Internal Medicine

## 2021-04-14 VITALS — BP 138/84 | HR 60 | Ht 70.0 in | Wt 223.4 lb

## 2021-04-14 DIAGNOSIS — I10 Essential (primary) hypertension: Secondary | ICD-10-CM

## 2021-04-14 DIAGNOSIS — I480 Paroxysmal atrial fibrillation: Secondary | ICD-10-CM | POA: Diagnosis not present

## 2021-04-14 DIAGNOSIS — G4733 Obstructive sleep apnea (adult) (pediatric): Secondary | ICD-10-CM | POA: Diagnosis not present

## 2021-04-14 IMAGING — US US ABDOMEN LIMITED RUQ/ASCITES
1 series · 14 of 25 positions shown · non-contrast
Comparison: None.

CLINICAL DATA: Increased LFTs

EXAM:
ULTRASOUND ABDOMEN LIMITED RIGHT UPPER QUADRANT

[Series 1: us abdomen limited ruq/ascites · 0.23mm/px · 14 of 49 slices shown]
[im 1/49]
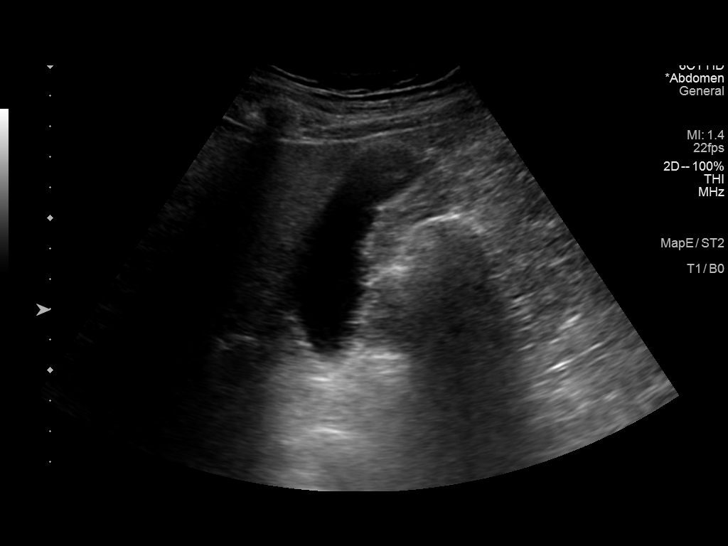
[im 5/49]
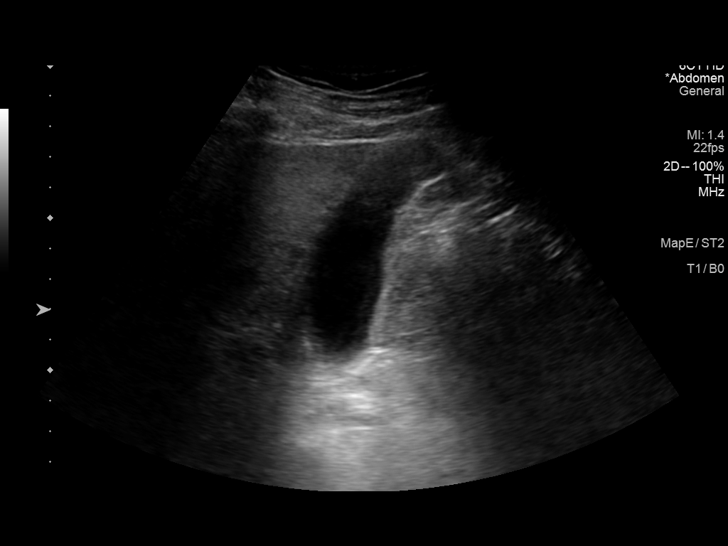
[im 9/49]
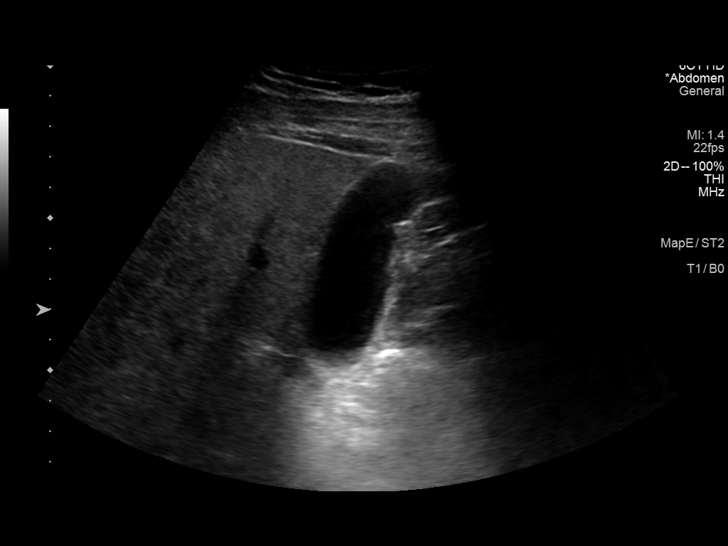
[im 13/49]
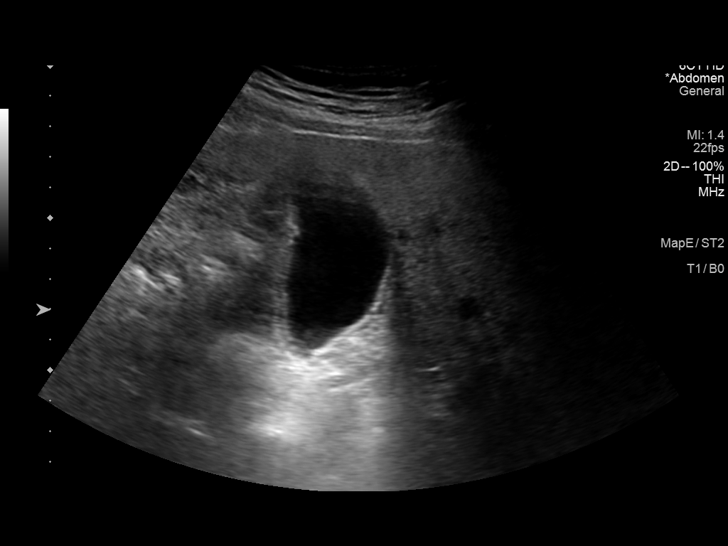
[im 17/49]
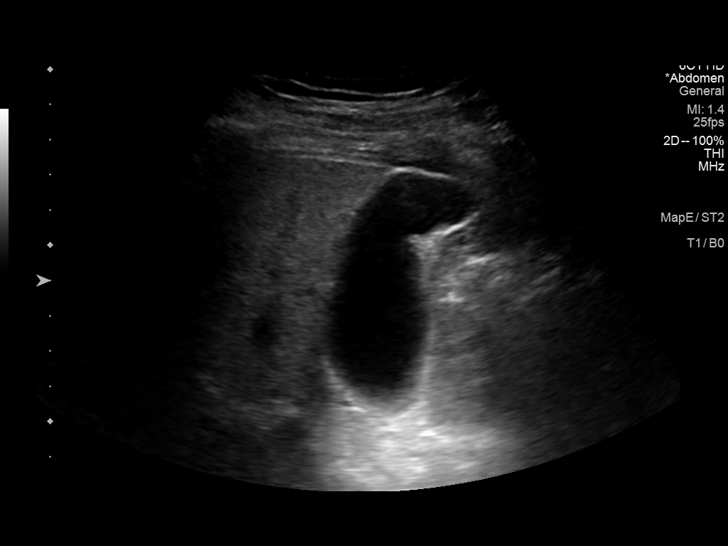
[im 19/49]
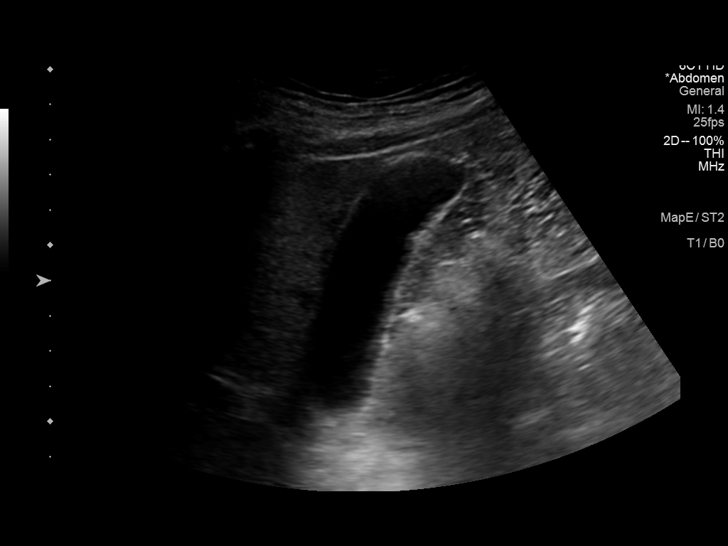
[im 23/49]
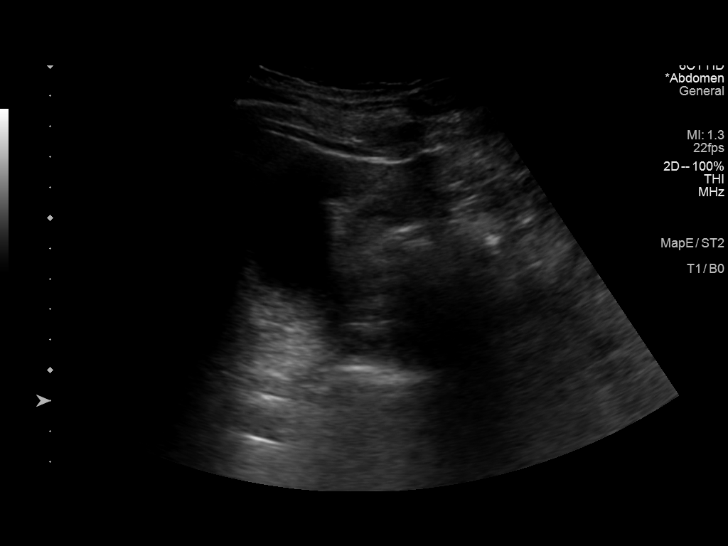
[im 27/49]
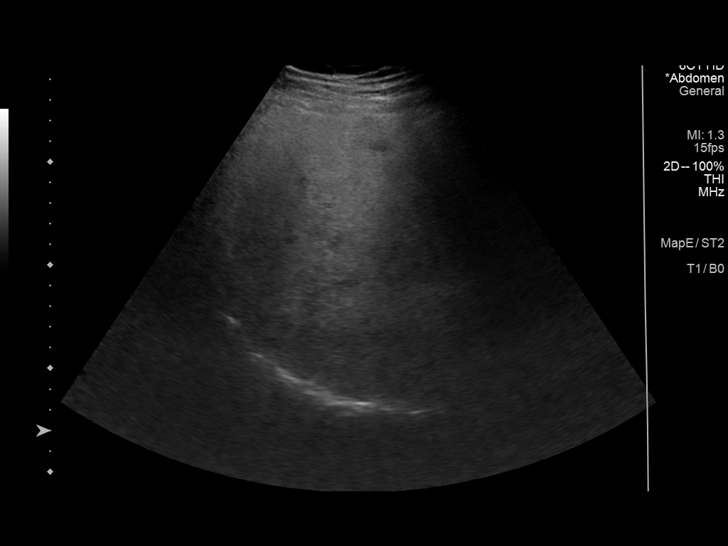
[im 31/49]
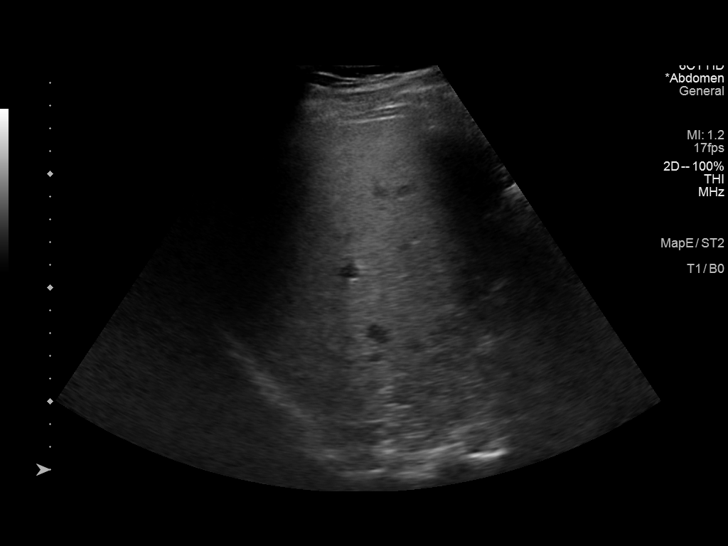
[im 33/49]
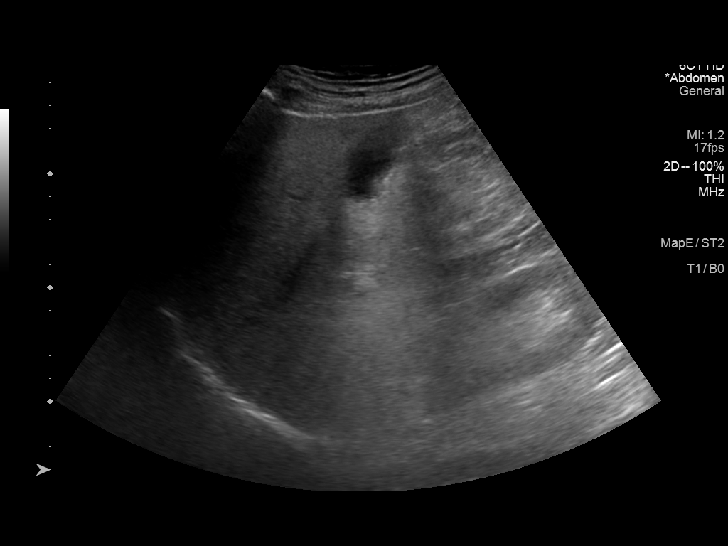
[im 37/49]
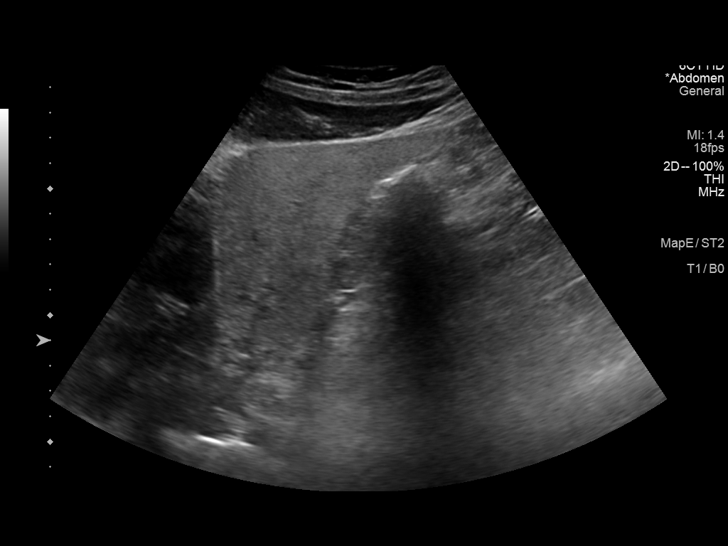
[im 41/49]
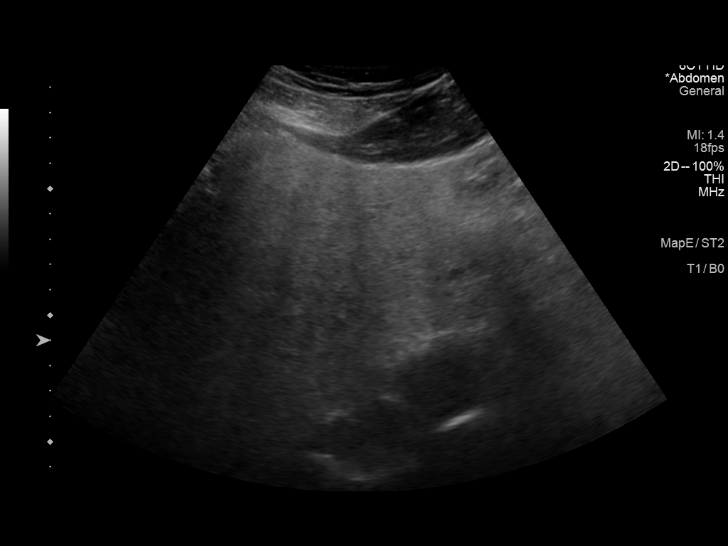
[im 45/49]
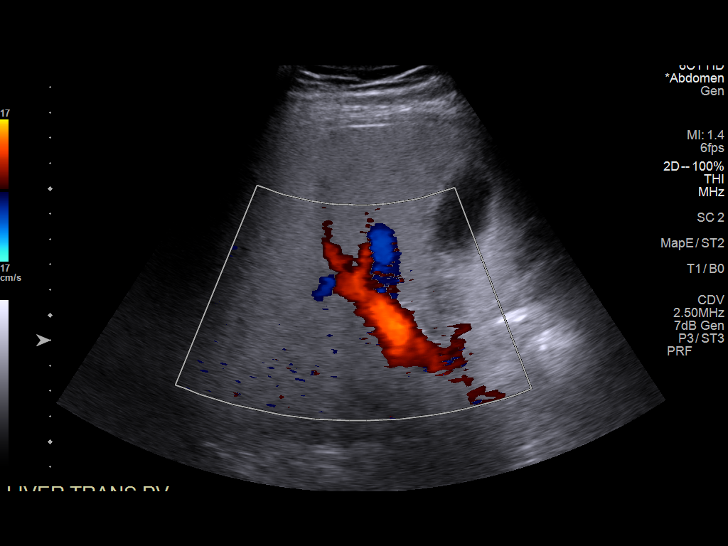
[im 49/49]
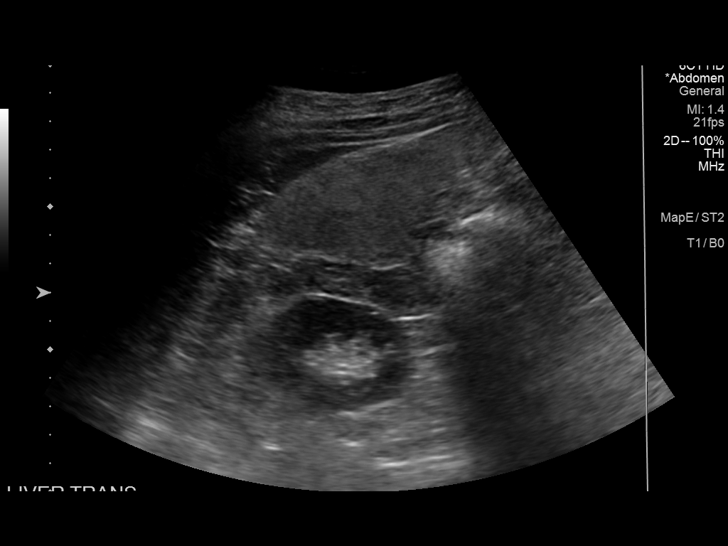

[14 of 25 positions shown; findings below may reference images not displayed]

FINDINGS: Gallbladder:

No gallstones or wall thickening visualized. No sonographic Murphy
sign noted by sonographer.

Common bile duct:

Diameter: 8 mm

Liver:

No focal lesion identified. Diffusely increased parenchymal
echogenicity. Portal vein is patent on color Doppler imaging with
normal direction of blood flow towards the liver.

Other: None.
IMPRESSION: 1. Mild dilation of the common bile duct, recommend correlation with
laboratory values for signs of biliary obstruction. If clinical
concern for biliary obstruction, suggest further evaluation with
MRCP/ERCP.

2. The echogenicity of the liver is increased. This is a nonspecific
finding but is most commonly seen with fatty infiltration of the
liver. There are no focal liver lesions identified.

## 2021-04-14 MED ORDER — ASPIRIN EC 81 MG PO TBEC: 81.0000 mg | DELAYED_RELEASE_TABLET | Freq: Every day | ORAL | 3 refills | Status: AC

## 2021-04-14 MED ORDER — METOPROLOL SUCCINATE ER 50 MG PO TB24: 50.0000 mg | ORAL_TABLET | Freq: Every day | ORAL | 3 refills | Status: DC

## 2021-04-14 NOTE — Progress Notes (Signed)
   PCP: Shirlean Mylar, MD Primary Cardiologist: Dr Katrinka Blazing Primary EP: Dr Brooke Bonito is a 65 y.o. male who presents today for routine electrophysiology followup.  Since last being seen in our clinic, the patient reports doing very well.  He has rare and brief palpitations. Today, he denies symptoms of chest pain, shortness of breath,  lower extremity edema, dizziness, presyncope, or syncope.  The patient is otherwise without complaint today.   Past Medical History:  Diagnosis Date  . Back pain   . GERD (gastroesophageal reflux disease)   . Hypertension   . OSA (obstructive sleep apnea)    not compliant with cpap  . Overweight   . Paroxysmal atrial fibrillation (HCC)    Lone atrial fibrillation by Holter July 2009. This echo demonstrates normal left ventricle size and function. LA size 42 mm    Past Surgical History:  Procedure Laterality Date  . ATRIAL FIBRILLATION ABLATION N/A 10/08/2020   Procedure: ATRIAL FIBRILLATION ABLATION;  Surgeon: [name], MD;  [facility]: [facility];  Service: Cardiovascular;  Laterality: N/A;  . wisdom teeth excised      ROS- all systems are reviewed and negatives except as per HPI above  Current Outpatient Medications  Medication Sig Dispense Refill  . FAMOTIDINE PO Take 2 tablets by mouth daily with supper.     . metoprolol succinate (TOPROL-XL) 100 MG 24 hr tablet Take 1 tablet (100 mg total) by mouth daily. Take with or immediately following a meal. 90 tablet 3  . metroNIDAZOLE (METROGEL) 1 % gel Apply 1 application topically at bedtime.     . Multiple Vitamin (MULTIVITAMIN) tablet Take 1 tablet by mouth daily. Men 50 +    . rivaroxaban (XARELTO) 20 MG TABS tablet Take 1 tablet (20 mg total) by mouth daily with supper. 90 tablet 3   No current facility-administered medications for this visit.    Physical Exam: Vitals:   04/14/21 0957  BP: 138/84  Pulse: 60  SpO2: 98%  Weight: 223 lb 6.4 oz (101.3 kg)  Height: 5\' 10"   (1.778 m)    GEN- The patient is well appearing, alert and oriented x 3 today.   Head- normocephalic, atraumatic Eyes-  Sclera clear, conjunctiva pink Ears- hearing intact Oropharynx- clear Lungs- Clear to ausculation bilaterally, normal work of breathing Heart- Regular rate and rhythm, no murmurs, rubs or gallops, PMI not laterally displaced GI- soft, NT, ND, + BS Extremities- no clubbing, cyanosis, or edema  Wt Readings from Last 3 Encounters:  04/14/21 223 lb 6.4 oz (101.3 kg)  01/28/21 220 lb (99.8 kg)  01/10/21 223 lb 3.2 oz (101.2 kg)    EKG tracing ordered today is personally reviewed and shows sinus rhythm  Assessment and Plan:  1. Paroxysmal atrial fibrillation Well controlled post ablation off AAD therapy chads2vasc score is 1.  He prefers to stop OAC therapy at this time. Stop OAC Start ASA 81mg  daily We discussed [employer] to further characterize palpitations Reduce toprol to 50mg  daily  2. HTN Stable No change required today  3. Overweight Body mass index is 32.05 kg/m. Lifestyle modification advised  4. OSA Compliance with CPAP advised Follows with Dr 01/12/21  Risks, benefits and potential toxicities for medications prescribed and/or refilled reviewed with patient today.   Follow-up with Dr in 3 months Return in 6 months to see me  MD, [facility] 04/14/2021 10:00 AM

## 2021-04-14 NOTE — Patient Instructions (Addendum)
Medication Instructions:  Stop Xarelto  Start Aspirin 81 mg daily Reduce your Metoprolol succinate to 50 mg daily  Your physician recommends that you continue on your current medications as directed. Please refer to the Current Medication list given to you today.  Labwork: None ordered.  Testing/Procedures: None ordered.  Follow-Up: Your physician wants you to follow-up in: 3 months with Dr. Katrinka Blazing and 6 months with [name], MD   Any Other Special Instructions Will Be Listed Below (If Applicable).  If you need a refill on your cardiac medications before your next appointment, please call your pharmacy.   [employer]  FDA-cleared EKG at your fingertips. - [employer]   [employer], [employer]. [email or web address]://[email or web address].[email or web address]   FDA-cleared, clinical grade mobile EKG monitor: Lourena Simmonds is the most clinically-validated mobile EKG used by the world's leading cardiac care medical professionals.  This may be useful in monitoring palpitations.  We do not have access to have them emailed and reviewed but will be glad to review while in the office.

## 2021-04-28 ENCOUNTER — Other Ambulatory Visit: Payer: Self-pay

## 2021-04-28 ENCOUNTER — Telehealth (INDEPENDENT_AMBULATORY_CARE_PROVIDER_SITE_OTHER): Payer: BC Managed Care – PPO | Admitting: Cardiology

## 2021-04-28 ENCOUNTER — Encounter: Payer: Self-pay | Admitting: Cardiology

## 2021-04-28 VITALS — HR 62 | Ht 70.0 in | Wt 220.0 lb

## 2021-04-28 DIAGNOSIS — G4733 Obstructive sleep apnea (adult) (pediatric): Secondary | ICD-10-CM | POA: Diagnosis not present

## 2021-04-28 DIAGNOSIS — E669 Obesity, unspecified: Secondary | ICD-10-CM

## 2021-04-28 DIAGNOSIS — I10 Essential (primary) hypertension: Secondary | ICD-10-CM | POA: Diagnosis not present

## 2021-04-28 NOTE — Progress Notes (Signed)
Virtual Visit via Video Note   This visit type was conducted due to national recommendations for restrictions regarding the COVID-19 Pandemic (e.g. social distancing) in an effort to limit this patient's exposure and mitigate transmission in our community.  Due to his co-morbid illnesses, this patient is at least at moderate risk for complications without adequate follow up.  This format is felt to be most appropriate for this patient at this time.  All issues noted in this document were discussed and addressed.  A limited physical exam was performed with this format.  Please refer to the patient's chart for his consent to telehealth for [facility]   Evaluation Performed:  Follow-up visit  This visit type was conducted due to national recommendations for restrictions regarding the COVID-19 Pandemic (e.g. social distancing).  This format is felt to be most appropriate for this patient at this time.  All issues noted in this document were discussed and addressed.  No physical exam was performed (except for noted visual exam findings with Video Visits).  Please refer to the patient's chart (MyChart message for video visits and phone note for telephone visits) for the patient's consent to telehealth for [facility].  Date:  04/28/2021   ID:  QUAN CYBULSKI, DOB [birth year], MRN [id number]  Patient Location:  Home  Provider location:   [place]  PCP:  Shirlean Mylar, MD  Cardiologist:  Lesleigh Noe, MD  Sleep Medicine:  Armanda Magic, MD Electrophysiologist:  None   Chief Complaint:  OSA  History of Present Illness:    Miguel Stevens is a 65 y.o. male who presents via audio/video conferencing for a telehealth visit today.    This is a 65yo male with a hx of PAF, GERD and HTN who was referred by Dr. Katrinka Blazing for sleep evaluation.  He was seen by Dr. Katrinka Blazing and mentioned that he snores but did not have excessive daytime sleepiness.  Due to hx of PAF a sleep study was recommended.  He  underwent PSG which showed moderate OSA with an AHI of 16.5/hr and O2 sats as low as 81% with nocturnal hypoxemia.  CPAP titration was recommended but before proceeding with CPAP he wanted an OV to discuss his treatment options.    He has had problems with his mask leaking on several OVs and elevated AHIs on downloads.   I ordered a nasal mask and chin strap and when I saw him a few months ago he had not received it due to insurance problems.   Apparently his insurance required that he wait 3 months from when he started PAP before he could get a new mask.  At the last visit he was still using the old mask which ws leaking but thought it may have improved some.  He was still having problems getting a full night sleep due to working constraints and was averaging 6 hours of sleep.  He would ometimes take off the mask in his sleep but had been less and had been able to keep the mask on through the night for the most part.    He is now back for follow up after getting a new nasal mask.  He just started the new mask on 04/07/21 and is doing much better.  He says that he still does not really like the mask but his apneas have significantly reduced.  He is doing well with his CPAP device and thinks that he has gotten more used to it.  He tolerates  the mask better now although he feels that it could fit better with different straps  He feels the pressure is adequate.  If he gets enough sleep at night he feels rested in the am and has no significant daytime sleepiness. He does not think that he snores.    The patient does not have symptoms concerning for COVID-19 infection (fever, chills, cough, or new shortness of breath).   Prior CV studies:   The following studies were reviewed today:  Sleep study, OV notes from Dr. Katrinka Blazing  Past Medical History:  Diagnosis Date  . Back pain   . GERD (gastroesophageal reflux disease)   . Hypertension   . OSA (obstructive sleep apnea)    not compliant with cpap  .  Overweight   . Paroxysmal atrial fibrillation (HCC)    Lone atrial fibrillation by Holter July 2009. This echo demonstrates normal left ventricle size and function. LA size 42 mm    Past Surgical History:  Procedure Laterality Date  . ATRIAL FIBRILLATION ABLATION N/A 10/08/2020   Procedure: ATRIAL FIBRILLATION ABLATION;  Surgeon: [name], MD;  [facility]: [facility];  Service: Cardiovascular;  Laterality: N/A;  . wisdom teeth excised       Current Meds  Medication Sig  . aspirin EC 81 MG tablet Take 1 tablet (81 mg total) by mouth daily. Swallow whole.  Marland Kitchen FAMOTIDINE PO Take 2 tablets by mouth daily with supper.   . metoprolol succinate (TOPROL-XL) 50 MG 24 hr tablet Take 1 tablet (50 mg total) by mouth daily. Take with or immediately following a meal.  . metroNIDAZOLE (METROGEL) 1 % gel Apply 1 application topically at bedtime.   . Multiple Vitamin (MULTIVITAMIN) tablet Take 1 tablet by mouth daily. Men 50 +     Allergies:   Patient has no known allergies.   Social History   Tobacco Use  . Smoking status: Never Smoker  . Smokeless tobacco: Never Used  Vaping Use  . Vaping Use: Never used  Substance Use Topics  . Alcohol use: Yes    Alcohol/week: 0.0 standard drinks    Comment: an occasionally beer .  Marland Kitchen Drug use: No     Family Hx: The patient's family history includes Heart disease in his father and mother.  ROS:   Please see the history of present illness.     All other systems reviewed and are negative.   Labs/Other Tests and Data Reviewed:    Recent Labs: 09/19/2020: BUN 17; Creatinine, Ser 1.31; Hemoglobin 16.4; Platelets 197; Potassium 4.3; Sodium 143   Recent Lipid Panel No results found for: CHOL, TRIG, HDL, CHOLHDL, LDLCALC, LDLDIRECT  Wt Readings from Last 3 Encounters:  04/28/21 220 lb (99.8 kg)  04/14/21 223 lb 6.4 oz (101.3 kg)  01/28/21 220 lb (99.8 kg)     Objective:    Vital Signs:  Pulse 62   Ht 5\' 10"  (1.778 m)   Wt 220 lb  (99.8 kg)   BMI 31.57 kg/m   Well nourished, well developed male in no acute distress. Well appearing, alert and conversant, regular work of breathing,  good skin color  Eyes- anicteric mouth- oral mucosa is pink  neuro- grossly intact skin- no apparent rash or lesions or cyanosis   ASSESSMENT & PLAN:    1.  OSA -  The patient is tolerating PAP therapy well without any problems. The PAP download was reviewed today and showed an AHI of 2.5/hr on auto CPAP cm H2O with 94%  compliance in using more than 4 hours nightly.  The patient has been using and benefiting from PAP use and will continue to benefit from therapy.  -he is doing much better with his apneas with the new mask  -he still feels the mask could work better with different straps and I encouraged him to talk with his DME about this  2.  HTN -continue Toprol XL 50mg  daily  3.  Obesity -I have encouraged him to get into a routine exercise program and cut back on carbs and portions.  -we talked about getting more aerobic exercise -He would like to start swimming -his BMI is > 30 and I would like it < 26  COVID-19 Education: The signs and symptoms of COVID-19 were discussed with the patient and how to seek care for testing (follow up with PCP or arrange E-visit).  The importance of social distancing was discussed today.  Patient Risk:   After full review of this patient's clinical status, I feel that they are at least moderate risk at this time.  Time:   Today, I have spent 13 minutes on telemedicine discussing medical problems including OSA and another 10 minutes reviewing sleep study and PAP compliance download since I saw him last as well as prior OV notes  Medication Adjustments/Labs and Tests Ordered: Current medicines are reviewed at length with the patient today.  Concerns regarding medicines are outlined above.  Tests Ordered: No orders of the defined types were placed in this encounter.  Medication Changes: No  orders of the defined types were placed in this encounter.   Disposition:  Follow up in 6 months  Signed, , MD  04/28/2021 8:55 AM    [facility]

## 2021-04-28 NOTE — Patient Instructions (Addendum)
Medication Instructions:  Your physician recommends that you continue on your current medications as directed. Please refer to the Current Medication list given to you today.  *If you need a refill on your cardiac medications before your next appointment, please call your pharmacy*  Follow-Up: At [facility], you and your health needs are our priority.  As part of our continuing mission to provide you with exceptional heart care, we have created designated Provider Care Teams.  These Care Teams include your primary Cardiologist (physician) and Advanced Practice Providers (APPs -  Physician Assistants and Nurse Practitioners) who all work together to provide you with the care you need, when you need it.  Your next appointment:   6 month(s)  The format for your next appointment:   In Person or Virtual Visit   Provider:   Armanda Magic, MD

## 2021-05-06 IMAGING — MR MR ABDOMEN WO/W CM MRCP
12 of 19 series · 25 of 48 positions shown · IV contrast (multihance)
Comparison: Abdominal ultrasound, 02/13/2021

CLINICAL DATA: Biliary ductal dilatation, elevated LFTs

EXAM:
MRI ABDOMEN WITHOUT AND WITH CONTRAST (INCLUDING MRCP)
TECHNIQUE: Multiplanar multisequence MR imaging of the abdomen was performed
both before and after the administration of intravenous contrast.
Heavily T2-weighted images of the biliary and pancreatic ducts were
obtained, and three-dimensional MRCP images were rendered by post
processing.
CONTRAST:  20mL MULTIHANCE GADOBENATE DIMEGLUMINE 529 MG/ML IV SOLN

[Series 3: cor haste · coronal · 5.0mm · 0.74mm/px · 3 of 41 slices shown]
[im 1/41]
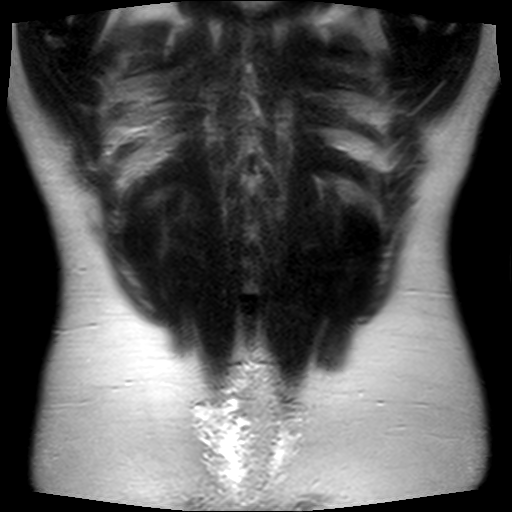
[im 21/41]
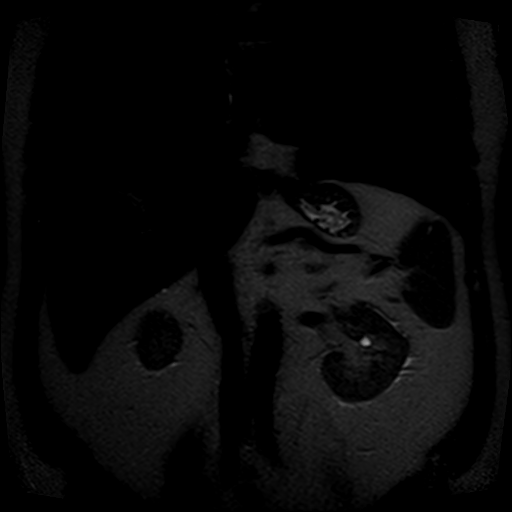
[im 41/41]
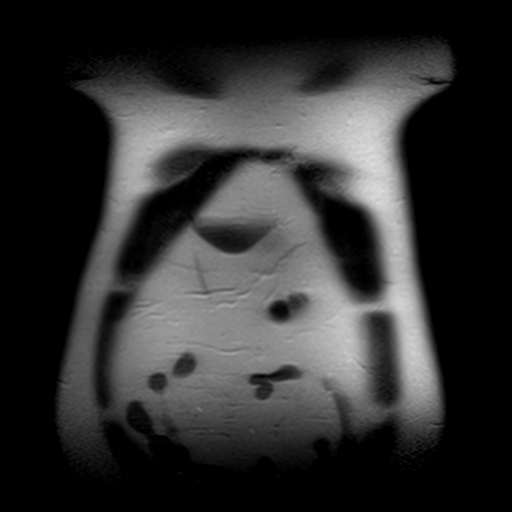

[Series 4: bSSFP · coronal · 5.5mm · 0.88mm/px · 2 of 37 slices shown (1 of 2)]
[im 1/37]
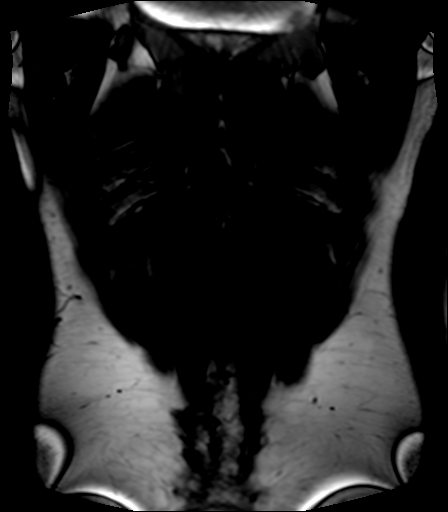
[im 37/37]
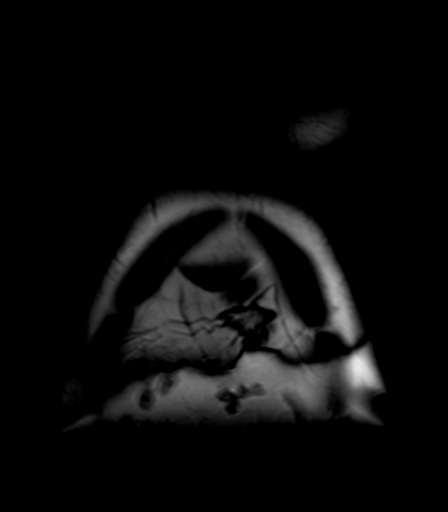

[Series 5: T1 · axial · 6.0mm · 0.74mm/px · z∈[-130,+81]mm · 3 of 66 slices shown]
[im 1/66]
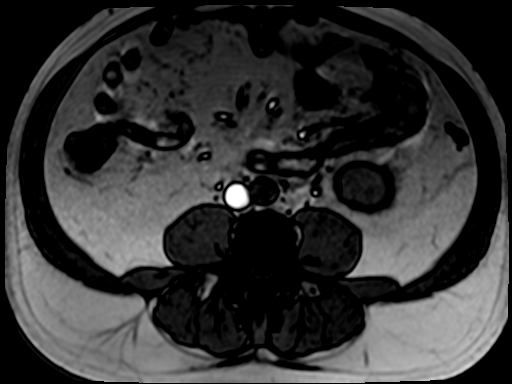
[im 33/66]
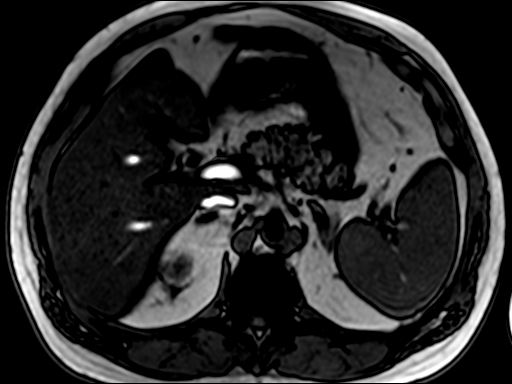
[im 66/66]
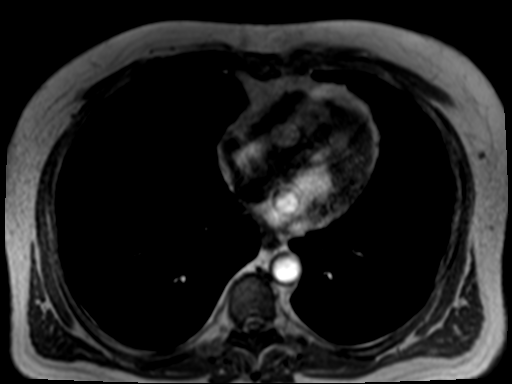

[Series 6: T2 · coronal · 3.0mm · 0.70mm/px · 2 of 48 slices shown (1 of 2)]
[im 1/48]
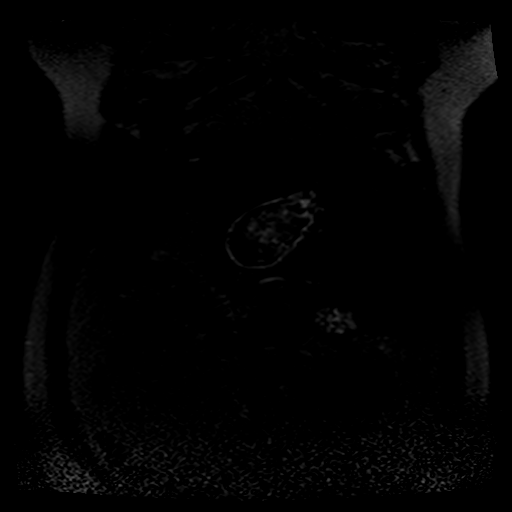
[im 48/48]
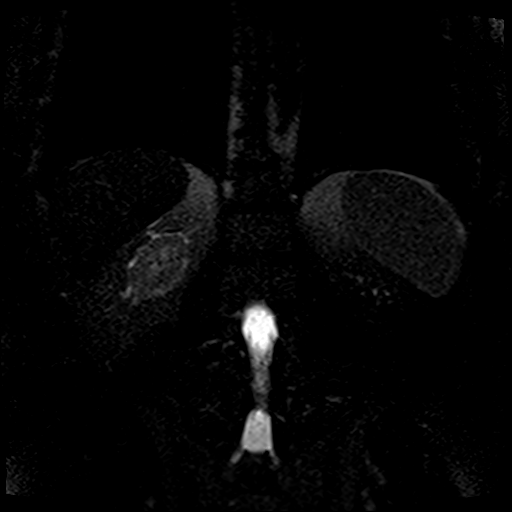

[Series 9: axial haste · axial · 6.0mm · 0.74mm/px · 1 of 33 slices shown]
[im 1/33]
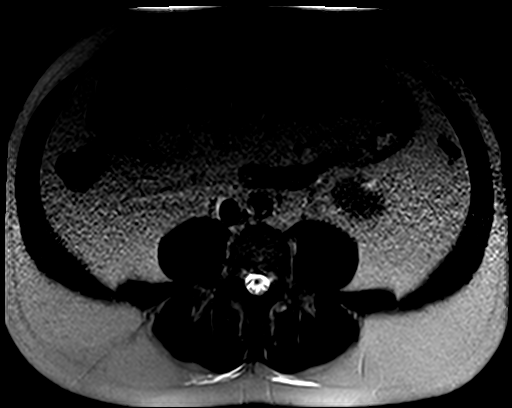

[Series 10: T2 · axial · 6.0mm · 1.12mm/px · 1 of 33 slices shown (2 of 2)]
[im 1/33]
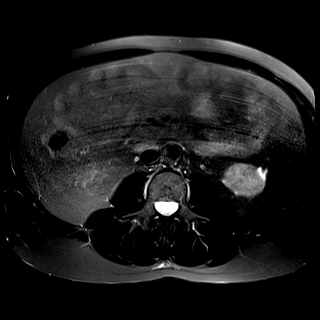

[Series 13: ep2d_diff_b50_500_800_p2_trig · axial · 6.0mm · 1.98mm/px · z∈[-95,+114]mm · 3 of 90 slices shown]
[im 1/90]
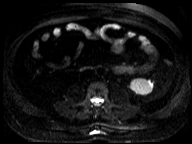
[im 45/90]
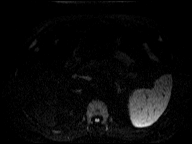
[im 90/90]
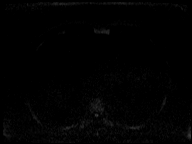

[Series 14: ep2d_diff_b50_500_800_p2_trig_adc · axial · 6.0mm · 1.98mm/px · 1 of 30 slices shown]
[im 1/30]
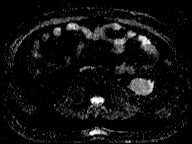

[Series 15: bSSFP · axial · 4.5mm · 0.74mm/px · z∈[-125,+69]mm · 2 of 44 slices shown (2 of 2)]
[im 1/44]
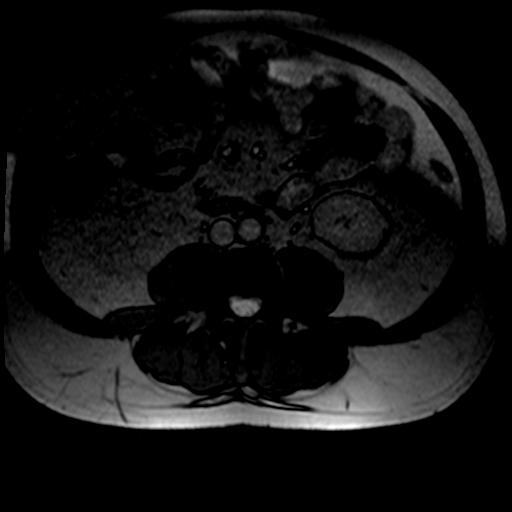
[im 44/44]
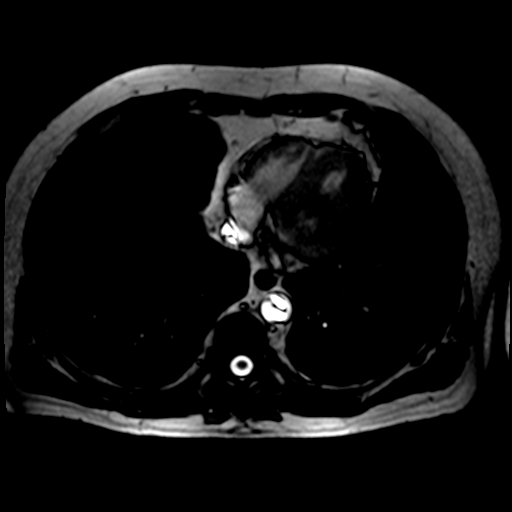

[Series 16: T1 dynamic · axial · non-contrast · 2.5mm · 0.74mm/px · z∈[-146,+72]mm · 3 of 88 slices shown]
[im 1/88]
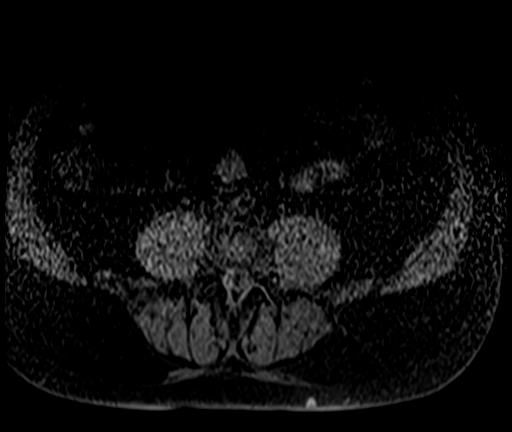
[im 44/88]
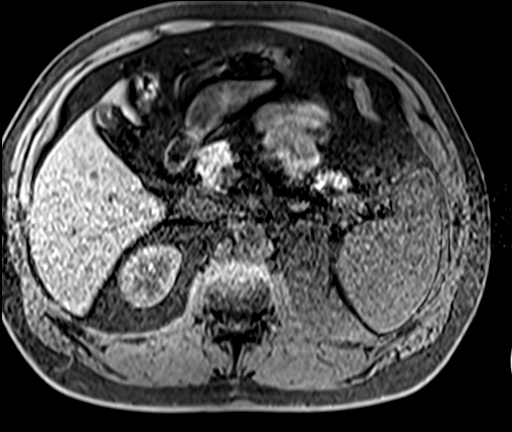
[im 88/88]
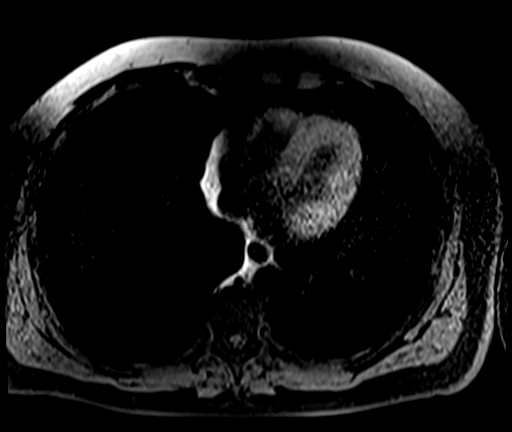

[Series 17: T1 dynamic post-contrast · axial · 2.5mm · 0.74mm/px · z∈[-146,+72]mm · 3 of 88 slices shown (1 of 2)]
[im 1/88]
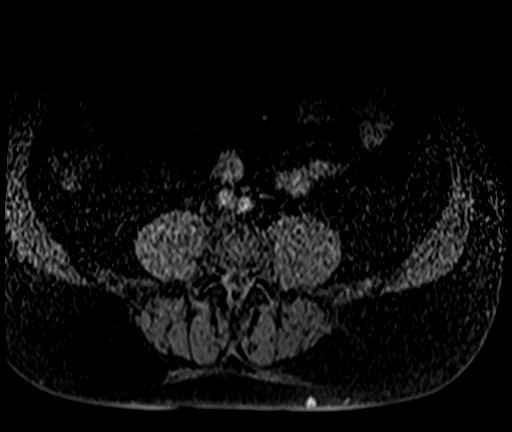
[im 44/88]
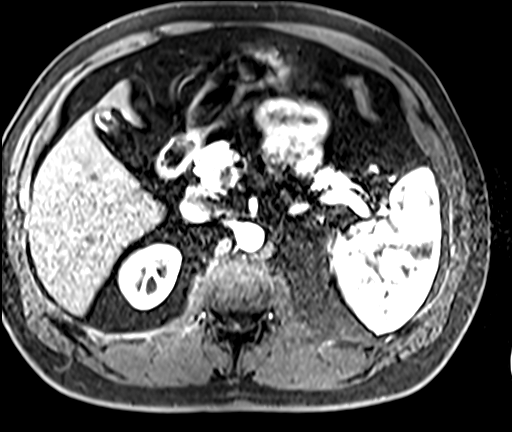
[im 88/88]
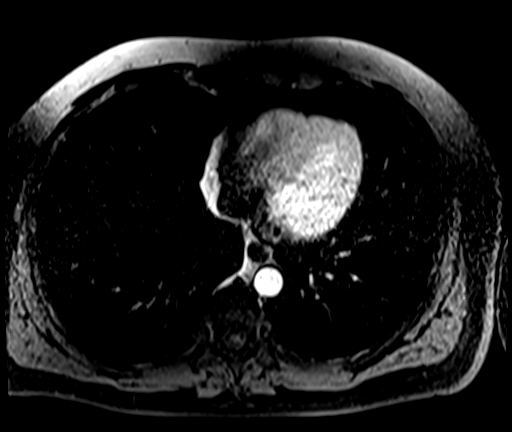

[Series 18: T1 dynamic post-contrast · axial · 2.5mm · 0.74mm/px · 1 of 88 slices shown (2 of 2)]
[im 1/88]
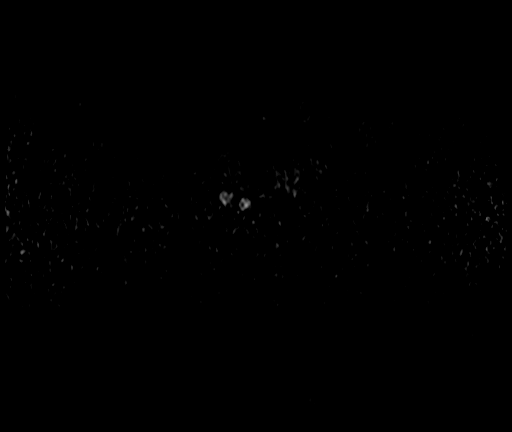

[25 of 48 positions shown; findings below may reference images not displayed]

FINDINGS: Lower chest: No acute findings.

Hepatobiliary: Severe hepatic steatosis. No mass or other
parenchymal abnormality identified. No biliary ductal dilatation.
The common bile duct measures no greater than 5 mm in caliber on
this examination.

Pancreas: No mass, inflammatory changes, or other parenchymal
abnormality identified.

Spleen:  Within normal limits in size and appearance.

Adrenals/Urinary Tract: No masses identified. No evidence of
hydronephrosis.

Stomach/Bowel: Visualized portions within the abdomen are
unremarkable.

Vascular/Lymphatic: No pathologically enlarged lymph nodes
identified. No abdominal aortic aneurysm demonstrated.

Other:  None.

Musculoskeletal: No suspicious bone lesions identified.
IMPRESSION: 1. Severe hepatic steatosis.
2. No biliary ductal dilatation.

## 2021-05-13 ENCOUNTER — Telehealth: Payer: Self-pay | Admitting: *Deleted

## 2021-05-13 NOTE — Telephone Encounter (Signed)
-----   Message from Quintella Reichert, MD sent at 05/12/2021 11:33 AM EDT ----- AHI too high - please find out how often he is changing out his cushion on his mask and if he has sleeping on his back

## 2021-05-13 NOTE — Telephone Encounter (Signed)
The patient has been notified of the result and verbalized understanding.  All questions (if any) were answered. Latrelle Dodrill, CMA 05/13/2021 8:58 AM.

## 2021-05-22 ENCOUNTER — Other Ambulatory Visit: Payer: Self-pay | Admitting: Interventional Cardiology

## 2021-06-08 ENCOUNTER — Other Ambulatory Visit: Payer: Self-pay | Admitting: Interventional Cardiology

## 2021-07-08 NOTE — Progress Notes (Signed)
Cardiology Office Note:    Date:  07/09/2021   ID:  Miguel Stevens, DOB May 28, 1956, MRN [id number]  PCP:  Shirlean Mylar, MD  Cardiologist:  Lesleigh Noe, MD   Referring MD: Shirlean Mylar, MD   No chief complaint on file.   History of Present Illness:    Miguel Stevens is a 65 y.o. male with a hx of PAF (>20years) with recent ablation by Dr. Johney Frame 2022, hypertension, hyperlipidemia, GERD, anticoagulation has been discontinued and aspirin substituted, and recently diagnosed OSA treated with CPAP.   Doing well.  No cardiopulmonary complaints.  No recurrence of atrial fibrillation after ablation.  Recently seen by Dr. Johney Frame.  He has no cardiac complaints.  I do note that he had atherosclerosis in the aorta on his cardiac CT with morphology and also had a coronary calcium score of 1, 2 years ago.  He denies chest discomfort of suspicious of angina.  Denies shortness of breath.  Has not been as active physically as he would like to be.  Past Medical History:  Diagnosis Date   Back pain    GERD (gastroesophageal reflux disease)    Hypertension    OSA (obstructive sleep apnea)    not compliant with cpap   Overweight    Paroxysmal atrial fibrillation (HCC)    Lone atrial fibrillation by Holter July 2009. This echo demonstrates normal left ventricle size and function. LA size 42 mm     Past Surgical History:  Procedure Laterality Date   ATRIAL FIBRILLATION ABLATION N/A 10/08/2020   Procedure: ATRIAL FIBRILLATION ABLATION;  Surgeon: [name], MD;  Location: [facility];  Service: Cardiovascular;  Laterality: N/A;   wisdom teeth excised      Current Medications: Current Meds  Medication Sig   aspirin EC 81 MG tablet Take 1 tablet (81 mg total) by mouth daily. Swallow whole.   FAMOTIDINE PO Take 2 tablets by mouth daily with supper.    metroNIDAZOLE (METROGEL) 1 % gel Apply 1 application topically at bedtime.    Multiple Vitamin (MULTIVITAMIN) tablet Take 1 tablet by  mouth daily. Men 50 +   rosuvastatin (CRESTOR) 5 MG tablet Take 1 tablet (5 mg total) by mouth daily.   [DISCONTINUED] metoprolol succinate (TOPROL-XL) 50 MG 24 hr tablet Take 1 tablet (50 mg total) by mouth daily. Take with or immediately following a meal.     Allergies:   Patient has no known allergies.   Social History   Socioeconomic History   Marital status: Married    Spouse name: Not on file   Number of children: Not on file   Years of education: Not on file   Highest education level: Not on file  Occupational History   Not on file  Tobacco Use   Smoking status: Never   Smokeless tobacco: Never  Vaping Use   Vaping Use: Never used  Substance and Sexual Activity   Alcohol use: Yes    Alcohol/week: 0.0 standard drinks    Comment: an occasionally beer .   Drug use: No   Sexual activity: Not on file  Other Topics Concern   Not on file  Social History Narrative   Former Oceanographer professor (now retired)    lives in [place]   He now works as a Research scientist (medical).     Social Determinants of Health   Financial Resource Strain: Not on file  Food Insecurity: Not on file  Transportation Needs: Not on file  Physical Activity:  Not on file  Stress: Not on file  Social Connections: Not on file     Family History: The patient's family history includes Heart disease in his father and mother.  ROS:   Please see the history of present illness.    He is sleeping better with CPAP.  All other systems reviewed and are negative.  EKGs/Labs/Other Studies Reviewed:    The following studies were reviewed today: CARDIAC CTA, Gated 10/03/2020 IMPRESSION: 1.  Mild LAE no LAA thrombus   2.  No ASD/PFO   3.  Normal aortic root 3.0 cm   4.  No pericardial effusion   5.  Normal PV anatomy see dimensions above   6.  Prominent azygous vein area of calcification   7. Normal coronary origins calcium score 19 which is 65 th percentile for age/sex isolated to LAD   EKG:  EKG  not repeated.  Last performed by Dr. Johney Frame  Recent Labs: 09/19/2020: BUN 17; Creatinine, Ser 1.31; Hemoglobin 16.4; Platelets 197; Potassium 4.3; Sodium 143  Recent Lipid Panel No results found for: CHOL, TRIG, HDL, CHOLHDL, VLDL, LDLCALC, LDLDIRECT  Physical Exam:    VS:  BP 132/82   Pulse 88   Ht 5\' 10"  (1.778 m)   Wt 225 lb 12.8 oz (102.4 kg)   SpO2 95%   BMI 32.40 kg/m     Wt Readings from Last 3 Encounters:  07/09/21 225 lb 12.8 oz (102.4 kg)  04/28/21 220 lb (99.8 kg)  04/14/21 223 lb 6.4 oz (101.3 kg)     GEN: Overweight. No acute distress HEENT: Normal NECK: No JVD. LYMPHATICS: No lymphadenopathy CARDIAC: No murmur. RRR no gallop, or edema. VASCULAR:  Normal Pulses. No bruits. RESPIRATORY:  Clear to auscultation without rales, wheezing or rhonchi  ABDOMEN: Soft, non-tender, non-distended, No pulsatile mass, MUSCULOSKELETAL: No deformity  SKIN: Warm and dry NEUROLOGIC:  Alert and oriented x 3 PSYCHIATRIC:  Normal affect   ASSESSMENT:    1. PAF (paroxysmal atrial fibrillation) (HCC)   2. Elevated coronary artery calcium score   3. Primary hypertension   4. OSA (obstructive sleep apnea)   5. Other hyperlipidemia   6. Morbid obesity (HCC)   7. Secondary hypercoagulable state (HCC)    PLAN:    In order of problems listed above:  Status post ablation.  Clinically in sinus rhythm.  No longer on anticoagulation.  Taking aspirin once per day. Rosuvastatin 5 mg/day is started.  Liver and lipid panel in 2 months. Blood pressure is excellent now that sleep apnea is controlled.  Beta-blocker requirement is less. CPAP compliance is recommended Target LDL less than 70.  Rosuvastatin 5 mg/day is started. Working on weight loss Anticoagulation has been discontinued   Medication Adjustments/Labs and Tests Ordered: Current medicines are reviewed at length with the patient today.  Concerns regarding medicines are outlined above.  Orders Placed This Encounter   Procedures   Lipid panel   Hepatic function panel   Meds ordered this encounter  Medications   metoprolol succinate (TOPROL-XL) 50 MG 24 hr tablet    Sig: Take 1 tablet (50 mg total) by mouth daily. Take with or immediately following a meal.    Dispense:  90 tablet    Refill:  3   rosuvastatin (CRESTOR) 5 MG tablet    Sig: Take 1 tablet (5 mg total) by mouth daily.    Dispense:  90 tablet    Refill:  3    Patient Instructions  Medication Instructions:  1)  START Rosuvastatin 5mg  once daily  *If you need a refill on your cardiac medications before your next appointment, please call your pharmacy*   Lab Work: Liver and Lipid in 2 months. You will need to be fasting for these labs (nothing to eat or drink after midnight except water and black coffee).  If you have labs (blood work) drawn today and your tests are completely normal, you will receive your results only by: MyChart Message (if you have MyChart) OR A paper copy in the mail If you have any lab test that is abnormal or we need to change your treatment, we will call you to review the results.   Testing/Procedures: None   Follow-Up: At [facility], you and your health needs are our priority.  As part of our continuing mission to provide you with exceptional heart care, we have created designated Provider Care Teams.  These Care Teams include your primary Cardiologist (physician) and Advanced Practice Providers (APPs -  Physician Assistants and Nurse Practitioners) who all work together to provide you with the care you need, when you need it.  We recommend signing up for the patient portal called "MyChart".  Sign up information is provided on this After Visit Summary.  MyChart is used to connect with patients for Virtual Visits (Telemedicine).  Patients are able to view lab/test results, encounter notes, upcoming appointments, etc.  Non-urgent messages can be sent to your provider as well.   To learn more about what you  can do with MyChart, go to [facility].    Your next appointment:   1 year(s)  The format for your next appointment:   In Person  Provider:   You may see [email or web address], MD or one of the following Advanced Practice Providers on your designated Care Team:   Lesleigh Noe, NP   Other Instructions     Signed, Nada Boozer, MD  07/09/2021 4:27 PM    [facility]

## 2021-07-09 ENCOUNTER — Encounter: Payer: Self-pay | Admitting: Interventional Cardiology

## 2021-07-09 ENCOUNTER — Ambulatory Visit: Payer: BC Managed Care – PPO | Admitting: Interventional Cardiology

## 2021-07-09 ENCOUNTER — Other Ambulatory Visit: Payer: Self-pay

## 2021-07-09 VITALS — BP 132/82 | HR 88 | Ht 70.0 in | Wt 225.8 lb

## 2021-07-09 DIAGNOSIS — E7849 Other hyperlipidemia: Secondary | ICD-10-CM

## 2021-07-09 DIAGNOSIS — I480 Paroxysmal atrial fibrillation: Secondary | ICD-10-CM | POA: Diagnosis not present

## 2021-07-09 DIAGNOSIS — I10 Essential (primary) hypertension: Secondary | ICD-10-CM

## 2021-07-09 DIAGNOSIS — G4733 Obstructive sleep apnea (adult) (pediatric): Secondary | ICD-10-CM | POA: Diagnosis not present

## 2021-07-09 DIAGNOSIS — R931 Abnormal findings on diagnostic imaging of heart and coronary circulation: Secondary | ICD-10-CM

## 2021-07-09 DIAGNOSIS — D6869 Other thrombophilia: Secondary | ICD-10-CM

## 2021-07-09 MED ORDER — METOPROLOL SUCCINATE ER 50 MG PO TB24: 50.0000 mg | ORAL_TABLET | Freq: Every day | ORAL | 3 refills | Status: DC

## 2021-07-09 MED ORDER — ROSUVASTATIN CALCIUM 5 MG PO TABS: 5.0000 mg | ORAL_TABLET | Freq: Every day | ORAL | 3 refills | Status: DC

## 2021-07-09 NOTE — Patient Instructions (Signed)
Medication Instructions:  1) START Rosuvastatin 5mg  once daily  *If you need a refill on your cardiac medications before your next appointment, please call your pharmacy*   Lab Work: Liver and Lipid in 2 months. You will need to be fasting for these labs (nothing to eat or drink after midnight except water and black coffee).  If you have labs (blood work) drawn today and your tests are completely normal, you will receive your results only by: MyChart Message (if you have MyChart) OR A paper copy in the mail If you have any lab test that is abnormal or we need to change your treatment, we will call you to review the results.   Testing/Procedures: None   Follow-Up: At [facility], you and your health needs are our priority.  As part of our continuing mission to provide you with exceptional heart care, we have created designated Provider Care Teams.  These Care Teams include your primary Cardiologist (physician) and Advanced Practice Providers (APPs -  Physician Assistants and Nurse Practitioners) who all work together to provide you with the care you need, when you need it.  We recommend signing up for the patient portal called "MyChart".  Sign up information is provided on this After Visit Summary.  MyChart is used to connect with patients for Virtual Visits (Telemedicine).  Patients are able to view lab/test results, encounter notes, upcoming appointments, etc.  Non-urgent messages can be sent to your provider as well.   To learn more about what you can do with MyChart, go to [facility].    Your next appointment:   1 year(s)  The format for your next appointment:   In Person  Provider:   You may see [email or web address], MD or one of the following Advanced Practice Providers on your designated Care Team:   Lesleigh Noe, NP   Other Instructions

## 2021-09-11 ENCOUNTER — Other Ambulatory Visit: Payer: Self-pay

## 2021-09-11 ENCOUNTER — Other Ambulatory Visit: Payer: BC Managed Care – PPO | Admitting: *Deleted

## 2021-09-11 DIAGNOSIS — E7849 Other hyperlipidemia: Secondary | ICD-10-CM

## 2021-09-11 LAB — LIPID PANEL
Chol/HDL Ratio: 3.2 ratio (ref 0.0–5.0)
Cholesterol, Total: 109 mg/dL (ref 100–199)
HDL: 34 mg/dL — ABNORMAL LOW (ref >39)
LDL Chol Calc (NIH): 50 mg/dL (ref 0–99)
Triglycerides: 146 mg/dL (ref 0–149)
VLDL Cholesterol Cal: 25 mg/dL (ref 5–40)

## 2021-09-11 LAB — HEPATIC FUNCTION PANEL
ALT: 75 IU/L — ABNORMAL HIGH (ref 0–44)
AST: 58 IU/L — ABNORMAL HIGH (ref 0–40)
Albumin: 4.4 g/dL (ref 3.8–4.8)
Alkaline Phosphatase: 63 IU/L (ref 44–121)
Bilirubin Total: 1.5 mg/dL — ABNORMAL HIGH (ref 0.0–1.2)
Bilirubin, Direct: 0.37 mg/dL (ref 0.00–0.40)
Total Protein: 6.6 g/dL (ref 6.0–8.5)

## 2021-10-02 ENCOUNTER — Other Ambulatory Visit: Payer: Self-pay | Admitting: *Deleted

## 2021-10-02 DIAGNOSIS — R748 Abnormal levels of other serum enzymes: Secondary | ICD-10-CM

## 2021-10-16 ENCOUNTER — Other Ambulatory Visit: Payer: BC Managed Care – PPO | Admitting: *Deleted

## 2021-10-16 ENCOUNTER — Other Ambulatory Visit: Payer: Self-pay

## 2021-10-16 DIAGNOSIS — R748 Abnormal levels of other serum enzymes: Secondary | ICD-10-CM

## 2021-10-16 LAB — HEPATIC FUNCTION PANEL
ALT: 74 IU/L — ABNORMAL HIGH (ref 0–44)
AST: 51 IU/L — ABNORMAL HIGH (ref 0–40)
Albumin: 4.3 g/dL (ref 3.8–4.8)
Alkaline Phosphatase: 70 IU/L (ref 44–121)
Bilirubin Total: 1.1 mg/dL (ref 0.0–1.2)
Bilirubin, Direct: 0.27 mg/dL (ref 0.00–0.40)
Total Protein: 6.7 g/dL (ref 6.0–8.5)

## 2021-10-20 ENCOUNTER — Ambulatory Visit: Payer: BC Managed Care – PPO | Admitting: Internal Medicine

## 2021-10-24 ENCOUNTER — Other Ambulatory Visit: Payer: Self-pay | Admitting: *Deleted

## 2021-10-24 DIAGNOSIS — R748 Abnormal levels of other serum enzymes: Secondary | ICD-10-CM

## 2021-11-03 ENCOUNTER — Encounter: Payer: Self-pay | Admitting: Internal Medicine

## 2021-11-03 ENCOUNTER — Ambulatory Visit: Payer: BC Managed Care – PPO | Admitting: Internal Medicine

## 2021-11-03 ENCOUNTER — Other Ambulatory Visit: Payer: Self-pay

## 2021-11-03 VITALS — BP 124/76 | HR 59 | Ht 70.0 in | Wt 231.0 lb

## 2021-11-03 DIAGNOSIS — I10 Essential (primary) hypertension: Secondary | ICD-10-CM

## 2021-11-03 DIAGNOSIS — G4733 Obstructive sleep apnea (adult) (pediatric): Secondary | ICD-10-CM

## 2021-11-03 DIAGNOSIS — I480 Paroxysmal atrial fibrillation: Secondary | ICD-10-CM | POA: Diagnosis not present

## 2021-11-03 NOTE — Progress Notes (Signed)
PCP: Shirlean Mylar, MD (Inactive) Primary Cardiologist: Dr Katrinka Blazing Primary EP: Dr Brooke Bonito is a 65 y.o. male who presents today for routine electrophysiology followup.  Since last being seen in our clinic, the patient reports doing very well.  Today, he denies symptoms of palpitations, chest pain, shortness of breath,  lower extremity edema, dizziness, presyncope, or syncope.  The patient is otherwise without complaint today.   Past Medical History:  Diagnosis Date   Back pain    GERD (gastroesophageal reflux disease)    Hypertension    OSA (obstructive sleep apnea)    not compliant with cpap   Overweight    Paroxysmal atrial fibrillation (HCC)    Lone atrial fibrillation by Holter July 2009. This echo demonstrates normal left ventricle size and function. LA size 42 mm    Past Surgical History:  Procedure Laterality Date   ATRIAL FIBRILLATION ABLATION N/A 10/08/2020   Procedure: ATRIAL FIBRILLATION ABLATION;  Surgeon: [name], MD;  Location: [facility];  Service: Cardiovascular;  Laterality: N/A;   wisdom teeth excised      ROS- all systems are reviewed and negatives except as per HPI above  Current Outpatient Medications  Medication Sig Dispense Refill   aspirin EC 81 MG tablet Take 1 tablet (81 mg total) by mouth daily. Swallow whole. 90 tablet 3   FAMOTIDINE PO Take 2 tablets by mouth daily with supper.      griseofulvin (GRIS-PEG) 250 MG tablet Take 250 mg by mouth 2 (two) times daily.     ketoconazole (NIZORAL) 2 % cream SMARTSIG:1 Topical Daily PRN     metoprolol succinate (TOPROL-XL) 50 MG 24 hr tablet Take 1 tablet (50 mg total) by mouth daily. Take with or immediately following a meal. 90 tablet 3   metroNIDAZOLE (METROGEL) 1 % gel Apply 1 application topically at bedtime.      Multiple Vitamin (MULTIVITAMIN) tablet Take 1 tablet by mouth daily. Men 50 +     rosuvastatin (CRESTOR) 5 MG tablet Take 1 tablet (5 mg total) by mouth daily. (Patient  not taking: Reported on 11/03/2021) 90 tablet 3   No current facility-administered medications for this visit.    Physical Exam: Vitals:   11/03/21 0907  BP: 124/76  Pulse: (!) 59  SpO2: 98%  Weight: 231 lb (104.8 kg)  Height: 5\' 10"  (1.778 m)    GEN- The patient is well appearing, alert and oriented x 3 today.   Head- normocephalic, atraumatic Eyes-  Sclera clear, conjunctiva pink Ears- hearing intact Oropharynx- clear Lungs- Clear to ausculation bilaterally, normal work of breathing Heart- Regular rate and rhythm, no murmurs, rubs or gallops, PMI not laterally displaced GI- soft, NT, ND, + BS Extremities- no clubbing, cyanosis, or edema  Wt Readings from Last 3 Encounters:  11/03/21 231 lb (104.8 kg)  07/09/21 225 lb 12.8 oz (102.4 kg)  04/28/21 220 lb (99.8 kg)    EKG tracing ordered today is personally reviewed and shows sinus rhythm 59 bpm, PR 200 msec,   Assessment and Plan:  Paroxysmal atrial fibrillation Well controlled post ablation off AAD therapy Chads2vasc score is 1.  As per guidelines, he does not require oac at this time. We discussed the option to reduce toprol.  He finds that this medicine has improved headaches.  He does not wish to change at this time.  2. HTN Stable No change required today  3. Overweight Body mass index is 33.15 kg/m. Lifestyle modification advised  4. OSA Compliance with CPAP advised  Follow-up in AF clinic in a year  Thompson Grayer MD, [facility] 11/03/2021 9:20 AM

## 2021-11-03 NOTE — Patient Instructions (Addendum)
Medication Instructions:  Your physician recommends that you continue on your current medications as directed. Please refer to the Current Medication list given to you today. *If you need a refill on your cardiac medications before your next appointment, please call your pharmacy*  Lab Work: None. If you have labs (blood work) drawn today and your tests are completely normal, you will receive your results only by: MyChart Message (if you have MyChart) OR A paper copy in the mail If you have any lab test that is abnormal or we need to change your treatment, we will call you to review the results.  Testing/Procedures: None.  Follow-Up: At [facility], you and your health needs are our priority.  As part of our continuing mission to provide you with exceptional heart care, we have created designated Provider Care Teams.  These Care Teams include your primary Cardiologist (physician) and Advanced Practice Providers (APPs -  Physician Assistants and Nurse Practitioners) who all work together to provide you with the care you need, when you need it.  Your physician wants you to follow-up in: 12 months the [facility]. They will contact you to schedule.    You will receive a reminder letter in the mail two months in advance. If you don't receive a letter, please call our office to schedule the follow-up appointment.  We recommend signing up for the patient portal called "MyChart".  Sign up information is provided on this After Visit Summary.  MyChart is used to connect with patients for Virtual Visits (Telemedicine).  Patients are able to view lab/test results, encounter notes, upcoming appointments, etc.  Non-urgent messages can be sent to your provider as well.   To learn more about what you can do with MyChart, go to [email or web address].    Any Other Special Instructions Will Be Listed Below (If Applicable).  [facility] Clinic phone number [phone]

## 2021-11-26 ENCOUNTER — Other Ambulatory Visit: Payer: Self-pay

## 2021-11-26 ENCOUNTER — Other Ambulatory Visit: Payer: BC Managed Care – PPO | Admitting: *Deleted

## 2021-11-26 DIAGNOSIS — R748 Abnormal levels of other serum enzymes: Secondary | ICD-10-CM

## 2021-11-26 LAB — HEPATIC FUNCTION PANEL
ALT: 84 IU/L — ABNORMAL HIGH (ref 0–44)
AST: 54 IU/L — ABNORMAL HIGH (ref 0–40)
Albumin: 4.2 g/dL (ref 3.8–4.8)
Alkaline Phosphatase: 65 IU/L (ref 44–121)
Bilirubin Total: 1.1 mg/dL (ref 0.0–1.2)
Bilirubin, Direct: 0.27 mg/dL (ref 0.00–0.40)
Total Protein: 6.4 g/dL (ref 6.0–8.5)

## 2022-01-02 ENCOUNTER — Other Ambulatory Visit: Payer: Self-pay

## 2022-01-02 ENCOUNTER — Telehealth (INDEPENDENT_AMBULATORY_CARE_PROVIDER_SITE_OTHER): Payer: BC Managed Care – PPO | Admitting: Cardiology

## 2022-01-02 VITALS — HR 60 | Ht 70.0 in | Wt 231.0 lb

## 2022-01-02 DIAGNOSIS — E669 Obesity, unspecified: Secondary | ICD-10-CM | POA: Diagnosis not present

## 2022-01-02 DIAGNOSIS — I10 Essential (primary) hypertension: Secondary | ICD-10-CM

## 2022-01-02 DIAGNOSIS — G4733 Obstructive sleep apnea (adult) (pediatric): Secondary | ICD-10-CM | POA: Diagnosis not present

## 2022-01-02 MED ORDER — FLUTICASONE PROPIONATE 50 MCG/ACT NA SUSP: 2.0000 | Freq: Every day | NASAL | 3 refills | Status: DC

## 2022-01-02 MED ORDER — SALINE SPRAY 0.65 % NA SOLN: 1.0000 | NASAL | 3 refills | Status: DC | PRN

## 2022-01-02 NOTE — Progress Notes (Signed)
Virtual Visit via Video Note   This visit type was conducted due to national recommendations for restrictions regarding the COVID-19 Pandemic (e.g. social distancing) in an effort to limit this patient's exposure and mitigate transmission in our community.  Due to his co-morbid illnesses, this patient is at least at moderate risk for complications without adequate follow up.  This format is felt to be most appropriate for this patient at this time.  All issues noted in this document were discussed and addressed.  A limited physical exam was performed with this format.  Please refer to the patient's chart for his consent to telehealth for [facility].   Evaluation Performed:  Follow-up visit   Date:  01/02/2022   ID:  Miguel Bucklerndrew C Dombrosky, DOB 1956/09/01, MRN [id number]  Patient Location:  Home  [place]   [place]  PCP:  Shirlean MylarWebb, Carol, MD  Cardiologist:  Lesleigh NoeHenry W Smith III, MD  Sleep Medicine:  Armanda Magicraci Marquavius Scaife, MD Electrophysiologist:  None   Chief Complaint:  OSA  History of Present Illness:    Miguel Stevens is a 66 y.o. male who presents via audio/video conferencing for a telehealth visit today.    This is a 66yo male with a hx of PAF, GERD and HTN who was referred by Dr. Katrinka BlazingSmith for sleep evaluation.  He was seen by Dr. Katrinka BlazingSmith and mentioned that he snores but did not have excessive daytime sleepiness.  Due to hx of PAF a sleep study was recommended.  He underwent PSG which showed moderate OSA with an AHI of 16.5/hr and O2 sats as low as 81% with nocturnal hypoxemia.   He is doing well with his CPAP device and thinks that he has gotten used to it.  He tolerates the mask and feels the pressure is adequate.  Since going on CPAP he feels rested in the am and has no significant daytime sleepiness.  He he denies any significant mouth or nasal dryness.  He does have some problems with nasal congestion at times.  He does not think that he snores.     The patient does not have symptoms  concerning for COVID-19 infection (fever, chills, cough, or new shortness of breath).   Prior CV studies:   The following studies were reviewed today:  PAP compliance download  Past Medical History:  Diagnosis Date   Back pain    GERD (gastroesophageal reflux disease)    Hypertension    OSA (obstructive sleep apnea)    not compliant with cpap   Overweight    Paroxysmal atrial fibrillation (HCC)    Lone atrial fibrillation by Holter July 2009. This echo demonstrates normal left ventricle size and function. LA size 42 mm    Past Surgical History:  Procedure Laterality Date   ATRIAL FIBRILLATION ABLATION N/A 10/08/2020   Procedure: ATRIAL FIBRILLATION ABLATION;  Surgeon: Hillis RangeAllred, James, MD;  [facility];  Service: Cardiovascular;  Laterality: N/A;   wisdom teeth excised       Current Meds  Medication Sig   aspirin EC 81 MG tablet Take 1 tablet (81 mg total) by mouth daily. Swallow whole.   FAMOTIDINE PO Take 2 tablets by mouth daily with supper.    griseofulvin (GRIS-PEG) 250 MG tablet Take 250 mg by mouth 2 (two) times daily.   ketoconazole (NIZORAL) 2 % cream SMARTSIG:1 Topical Daily PRN   metoprolol succinate (TOPROL-XL) 50 MG 24 hr tablet Take 1 tablet (50 mg total) by mouth daily. Take with or  immediately following a meal.   metroNIDAZOLE (METROGEL) 1 % gel Apply 1 application topically at bedtime.    Multiple Vitamin (MULTIVITAMIN) tablet Take 1 tablet by mouth daily. Men 50 +     Allergies:   Patient has no known allergies.   Social History   Tobacco Use   Smoking status: Never   Smokeless tobacco: Never  Vaping Use   Vaping Use: Never used  Substance Use Topics   Alcohol use: Yes    Alcohol/week: 0.0 standard drinks    Comment: an occasionally beer .   Drug use: No     Family Hx: The patient's family history includes Heart disease in his father and mother.  ROS:   Please see the history of present illness.     All other systems reviewed  and are negative.   Labs/Other Tests and Data Reviewed:    Recent Labs: 11/26/2021: ALT 84   Recent Lipid Panel Lab Results  Component Value Date/Time   CHOL 109 09/11/2021 08:59 AM   TRIG 146 09/11/2021 08:59 AM   HDL 34 (L) 09/11/2021 08:59 AM   CHOLHDL 3.2 09/11/2021 08:59 AM   LDLCALC 50 09/11/2021 08:59 AM    Wt Readings from Last 3 Encounters:  01/02/22 231 lb (104.8 kg)  11/03/21 231 lb (104.8 kg)  07/09/21 225 lb 12.8 oz (102.4 kg)     Objective:    Vital Signs:  Pulse 60    Ht 5\' 10"  (1.778 m)    Wt 231 lb (104.8 kg)    BMI 33.15 kg/m   Well nourished, well developed male in no acute distress. Well appearing, alert and conversant, regular work of breathing,  good skin color  Eyes- anicteric mouth- oral mucosa is pink  neuro- grossly intact skin- no apparent rash or lesions or cyanosis   ASSESSMENT & PLAN:    1.  OSA - The patient is tolerating PAP therapy well without any problems. The PAP download performed by his DME was personally reviewed and interpreted by me today and showed an AHI of 0.8 /hr on AutoPap cm H2O with 97 % compliance in using more than 4 hours nightly.  The patient has been using and benefiting from PAP use and will continue to benefit from therapy.  -I will send in a Rx for flonase 1 spray each nostril qhs  -encouraged him to use nasal saline spray 2 spray BID for nasal congestion   2.  HTN -BP is controlled at home  -Continue prescription drug management with Toprol-XL 50 mg daily with as needed refills   3.  Obesity -His weight is up 6 pounds from the summer  -I have encouraged him to get into a routine exercise program and cut back on carbs and portions.   COVID-19 Education: The signs and symptoms of COVID-19 were discussed with the patient and how to seek care for testing (follow up with PCP or arrange E-visit).  The importance of social distancing was discussed today.  Patient Risk:   After full review of this patient's clinical  status, I feel that they are at least moderate risk at this time.  Time:   Today, I have spent 25 minutes on telemedicine discussing medical problems including OSA , HTN and Obesity  Medication Adjustments/Labs and Tests Ordered: Current medicines are reviewed at length with the patient today.  Concerns regarding medicines are outlined above.  Tests Ordered: No orders of the defined types were placed in this encounter.  Medication Changes: No  orders of the defined types were placed in this encounter.   Disposition:  Follow up  in 1 year Signed, Armanda Magic, MD  01/02/2022 9:59 AM    [facility]

## 2022-01-02 NOTE — Addendum Note (Signed)
Addended by: Antonieta Iba on: 01/02/2022 10:09 AM   Modules accepted: Orders

## 2022-01-02 NOTE — Patient Instructions (Signed)
Medication Instructions:  Your physician has recommended you make the following change in your medication: 1) START taking Flonase 2 sprays in each nostril every night 2) START taking nasal saline 2 sprays in each nostril twice daily   *If you need a refill on your cardiac medications before your next appointment, please call your pharmacy*  Follow-Up: At [facility], you and your health needs are our priority.  As part of our continuing mission to provide you with exceptional heart care, we have created designated Provider Care Teams.  These Care Teams include your primary Cardiologist (physician) and Advanced Practice Providers (APPs -  Physician Assistants and Nurse Practitioners) who all work together to provide you with the care you need, when you need it.  Your next appointment:   1 year(s)  The format for your next appointment:   In Person  Provider:  Fransico Him, MD1}

## 2022-07-16 ENCOUNTER — Other Ambulatory Visit: Payer: Self-pay | Admitting: Interventional Cardiology

## 2022-08-19 ENCOUNTER — Other Ambulatory Visit: Payer: Self-pay | Admitting: Interventional Cardiology

## 2022-08-28 ENCOUNTER — Telehealth: Payer: Self-pay | Admitting: Interventional Cardiology

## 2022-08-28 MED ORDER — METOPROLOL SUCCINATE ER 50 MG PO TB24: ORAL_TABLET | ORAL | 1 refills | Status: DC

## 2022-08-28 NOTE — Telephone Encounter (Signed)
*  STAT* If patient is at the pharmacy, call can be transferred to refill team.   1. Which medications need to be refilled? (please list name of each medication and dose if known) metoprolol succinate (TOPROL-XL) 50 MG 24 hr tablet  2. Which pharmacy/location (including street and city if local pharmacy) is medication to be sent to? [id number] - [place], [place] - [address]  3. Do they need a 30 day or 90 day supply? 90 day  Patient has an appointment 12/01/2022

## 2022-08-28 NOTE — Telephone Encounter (Signed)
Pt's medication was sent to pt's pharmacy as requested. Confirmation received.  °

## 2022-10-29 ENCOUNTER — Inpatient Hospital Stay (HOSPITAL_COMMUNITY)
Admission: RE | Admit: 2022-10-29 | Payer: BC Managed Care – PPO | Source: Ambulatory Visit | Admitting: Physician Assistant

## 2022-10-29 ENCOUNTER — Encounter (HOSPITAL_COMMUNITY): Payer: Self-pay

## 2022-11-04 ENCOUNTER — Ambulatory Visit (HOSPITAL_COMMUNITY)
Admission: RE | Admit: 2022-11-04 | Discharge: 2022-11-04 | Disposition: A | Discharge status: Home / Self Care | Payer: BC Managed Care – PPO | Source: Ambulatory Visit | Attending: Physician Assistant | Admitting: Physician Assistant

## 2022-11-04 ENCOUNTER — Encounter (HOSPITAL_COMMUNITY): Payer: Self-pay | Admitting: Physician Assistant

## 2022-11-04 VITALS — BP 132/84 | HR 64 | Ht 70.0 in | Wt 231.6 lb

## 2022-11-04 DIAGNOSIS — G4733 Obstructive sleep apnea (adult) (pediatric): Secondary | ICD-10-CM | POA: Diagnosis not present

## 2022-11-04 DIAGNOSIS — I480 Paroxysmal atrial fibrillation: Secondary | ICD-10-CM | POA: Diagnosis present

## 2022-11-04 DIAGNOSIS — I10 Essential (primary) hypertension: Secondary | ICD-10-CM | POA: Insufficient documentation

## 2022-11-04 NOTE — Progress Notes (Signed)
Primary Care Physician: Shirlean Mylar, MD Referring Physician:Dr. Allred  Primary cardiologist: Dr Katrinka Blazing Sleep medicine: Dr Benjamine Mola is a 66 y.o. male with a h/o HTN, paroxysmal afib, OSA who presents for follow up in the [facility]. Patient has a CHA2DS2VASc score of 1. He is s/p afib ablation with Dr Johney Frame in 2021.  On follow up today, patient reports that he has done well since his last visit. He has not had any known episodes of afib. He is compliant with his CPAP device.   Today, he denies symptoms of palpitations, chest pain, shortness of breath, orthopnea, PND, lower extremity edema, dizziness, presyncope, syncope, or neurologic sequela. The patient is tolerating medications without difficulties and is otherwise without complaint today.   Past Medical History:  Diagnosis Date   Back pain    GERD (gastroesophageal reflux disease)    Hypertension    OSA (obstructive sleep apnea)    not compliant with cpap   Overweight    Paroxysmal atrial fibrillation (HCC)    Lone atrial fibrillation by Holter July 2009. This echo demonstrates normal left ventricle size and function. LA size 42 mm    Past Surgical History:  Procedure Laterality Date   ATRIAL FIBRILLATION ABLATION N/A 10/08/2020   Procedure: ATRIAL FIBRILLATION ABLATION;  Surgeon: [name], MD;  [facility]: [facility];  Service: Cardiovascular;  Laterality: N/A;   wisdom teeth excised      Current Outpatient Medications  Medication Sig Dispense Refill   aspirin EC 81 MG tablet Take 1 tablet (81 mg total) by mouth daily. Swallow whole. 90 tablet 3   FAMOTIDINE PO Take 2 tablets by mouth daily with supper.      fluticasone (FLONASE ALLERGY RELIEF) 50 MCG/ACT nasal spray Place 2 sprays into both nostrils at bedtime. 11.1 mL 3   griseofulvin (GRIS-PEG) 250 MG tablet Take 250 mg by mouth 2 (two) times daily.     ketoconazole (NIZORAL) 2 % cream SMARTSIG:1 Topical Daily PRN      metoprolol succinate (TOPROL-XL) 50 MG 24 hr tablet TAKE 1 TABLET BY MOUTH EVERY DAY WITH OR IMMEDIATELY FOLLOWING A MEAL 90 tablet 1   Multiple Vitamin (MULTIVITAMIN) tablet Take 1 tablet by mouth daily. Men 50 +     sodium chloride (OCEAN) 0.65 % SOLN nasal spray Place 1 spray into both nostrils as needed for congestion. 60 mL 3   No current facility-administered medications for this encounter.    No Known Allergies  Social History   Socioeconomic History   Marital status: Married    Spouse name: Not on file   Number of children: Not on file   Years of education: Not on file   Highest education level: Not on file  Occupational History   Not on file  Tobacco Use   Smoking status: Never   Smokeless tobacco: Never   Tobacco comments:    Never smoke 11/04/22  Vaping Use   Vaping Use: Never used  Substance and Sexual Activity   Alcohol use: Yes    Alcohol/week: 0.0 standard drinks of alcohol    Comment: an occasionally beer .   Drug use: No   Sexual activity: Not on file  Other Topics Concern   Not on file  Social History Narrative   Former Oceanographer professor (now retired)    lives in [place]   He now works as a Research scientist (medical).     Social Determinants of Health  Financial Resource Strain: Not on file  Food Insecurity: Not on file  Transportation Needs: Not on file  Physical Activity: Not on file  Stress: Not on file  Social Connections: Not on file  Intimate Partner Violence: Not on file    Family History  Problem Relation Age of Onset   Heart disease Mother    Heart disease Father     ROS- All systems are reviewed and negative except as per the HPI above  Physical Exam: Vitals:   11/04/22 1104  BP: 132/84  Pulse: 64  Weight: 105.1 kg  Height: 5\' 10"  (1.778 m)   Wt Readings from Last 3 Encounters:  11/04/22 105.1 kg  01/02/22 104.8 kg  11/03/21 104.8 kg    Labs: Lab Results  Component Value Date   NA 143 09/19/2020   K 4.3 09/19/2020    CL 106 09/19/2020   CO2 27 09/19/2020   GLUCOSE 96 09/19/2020   BUN 17 09/19/2020   CREATININE 1.31 (H) 09/19/2020   CALCIUM 9.2 09/19/2020   No results found for: "INR" Lab Results  Component Value Date   CHOL 109 09/11/2021   HDL 34 (L) 09/11/2021   LDLCALC 50 09/11/2021   TRIG 146 09/11/2021     GEN- The patient is a well appearing male, alert and oriented x 3 today.   HEENT-head normocephalic, atraumatic, sclera clear, conjunctiva pink, hearing intact, trachea midline. Lungs- Clear to ausculation bilaterally, normal work of breathing Heart- Regular rate and rhythm, no murmurs, rubs or gallops  GI- soft, NT, ND, + BS Extremities- no clubbing, cyanosis, or edema MS- no significant deformity or atrophy Skin- no rash or lesion Psych- euthymic mood, full affect Neuro- strength and sensation are intact   EKG- SR, 1st degree AV block Vent. rate 64 BPM PR interval 202 ms QRS duration 94 ms QT/QTcB 434/447 ms   Epic records reviewed    Assessment and Plan: 1. Paroxysmal afib  S/p ablation 2021 Patient appears to be maintaining SR. Continue Toprol 50 mg daily Not currently on anticoagulation with low CV score.  2. HTN Stable, no changes today.  3. OSA Followed by Dr 2022 Encouraged compliance with CPAP therapy. Patient interested in alternative treatments for OSA, briefly discussed Inspire. He plans to d/w Dr Mayford Knife.    Follow up in the AF clinic in one year.    Mayford Knife PA-C Afib Clinic [facility] [address] [place], [place] [phone]

## 2022-11-30 NOTE — Progress Notes (Signed)
Cardiology Office Note:    Date:  12/01/2022   ID:  Miguel Stevens, DOB Dec 28, 1955, MRN [id number]  PCP:  Shirlean Mylar, MD  Cardiologist:  Lesleigh Noe, MD   Referring MD: Shirlean Mylar, MD   Chief Complaint  Patient presents with   Atrial Fibrillation   Hyperlipidemia   Follow-up    Obstructive sleep apnea-Dr. Mayford Knife    History of Present Illness:    Miguel Stevens is a 66 y.o. male with a hx of PAF (>20years) with recent ablation by Dr. Johney Frame 2022, hypertension, hyperlipidemia, GERD, anticoagulation has been discontinued and aspirin substituted, and recently diagnosed OSA treated with CPAP.   No recurrence of atrial fibrillation since successful ablation in 2022.  Has been compliant with CPAP although he hates wearing the device and hopes to have alternative treatment measures such as Inspire.  Apnea, PND, angina, syncope, and edema.  No specific medication side effects.  Past Medical History:  Diagnosis Date   Back pain    GERD (gastroesophageal reflux disease)    Hypertension    OSA (obstructive sleep apnea)    not compliant with cpap   Overweight    Paroxysmal atrial fibrillation (HCC)    Lone atrial fibrillation by Holter July 2009. This echo demonstrates normal left ventricle size and function. LA size 42 mm     Past Surgical History:  Procedure Laterality Date   ATRIAL FIBRILLATION ABLATION N/A 10/08/2020   Procedure: ATRIAL FIBRILLATION ABLATION;  Surgeon: [name], MD;  [facility]: [facility];  Service: Cardiovascular;  Laterality: N/A;   wisdom teeth excised      Current Medications: Current Meds  Medication Sig   aspirin EC 81 MG tablet Take 1 tablet (81 mg total) by mouth daily. Swallow whole.   FAMOTIDINE PO Take 2 tablets by mouth daily with supper.    griseofulvin (GRIS-PEG) 250 MG tablet Take 250 mg by mouth 2 (two) times daily.   Homeopathic Products (PROSACEA EX) Apply 1 Application topically daily at 6 (six) AM. Daily treatment for  rosacea.   metoprolol succinate (TOPROL-XL) 50 MG 24 hr tablet TAKE 1 TABLET BY MOUTH EVERY DAY WITH OR IMMEDIATELY FOLLOWING A MEAL   Multiple Vitamin (MULTIVITAMIN) tablet Take 1 tablet by mouth daily. Men 50 +   sodium chloride (OCEAN) 0.65 % SOLN nasal spray Place 1 spray into both nostrils as needed for congestion.     Allergies:   Patient has no known allergies.   Social History   Socioeconomic History   Marital status: Married    Spouse name: Not on file   Number of children: Not on file   Years of education: Not on file   Highest education level: Not on file  Occupational History   Not on file  Tobacco Use   Smoking status: Never   Smokeless tobacco: Never   Tobacco comments:    Never smoke 11/04/22  Vaping Use   Vaping Use: Never used  Substance and Sexual Activity   Alcohol use: Yes    Alcohol/week: 0.0 standard drinks of alcohol    Comment: an occasionally beer .   Drug use: No   Sexual activity: Not on file  Other Topics Concern   Not on file  Social History Narrative   Former Oceanographer professor (now retired)    lives in [place]   He now works as a Research scientist (medical).     Social Determinants of Health   Financial Resource Strain: Not on file  Food Insecurity: Not on file  Transportation Needs: Not on file  Physical Activity: Not on file  Stress: Not on file  Social Connections: Not on file     Family History: The patient's family history includes Heart disease in his father and mother.  ROS:   Please see the history of present illness.    We discussed diet.  He is stop drinking beer.  He continues to gain weight and is concerned about this.  All other systems reviewed and are negative.  EKGs/Labs/Other Studies Reviewed:    The following studies were reviewed today: No new imaging Coronary calcium score was 1 in 2020. Echocardiogram in 2020 demonstrated mild LVH with EF 60% and mildly dilated left atrium.  EKG:  EKG/ 11/04/2022 demonstrated  normal sinus rhythm.  Normal EKG.  Recent Labs: No results found for requested labs within last 365 days.  Recent Lipid Panel    Component Value Date/Time   CHOL 109 09/11/2021 0859   TRIG 146 09/11/2021 0859   HDL 34 (L) 09/11/2021 0859   CHOLHDL 3.2 09/11/2021 0859   LDLCALC 50 09/11/2021 0859    Physical Exam:    VS:  BP 116/70   Pulse 60   Ht 5\' 10"  (1.778 m)   Wt 231 lb 3.2 oz (104.9 kg)   SpO2 96%   BMI 33.17 kg/m     Wt Readings from Last 3 Encounters:  12/01/22 231 lb 3.2 oz (104.9 kg)  11/04/22 231 lb 9.6 oz (105.1 kg)  01/02/22 231 lb (104.8 kg)     GEN: Overweight with BMI 33. No acute distress HEENT: Normal NECK: No JVD. LYMPHATICS: No lymphadenopathy CARDIAC: No murmur. RRR no gallop, or edema. VASCULAR:  Normal Pulses. No bruits. RESPIRATORY:  Clear to auscultation without rales, wheezing or rhonchi  ABDOMEN: Soft, non-tender, non-distended, No pulsatile mass, MUSCULOSKELETAL: No deformity  SKIN: Warm and dry NEUROLOGIC:  Alert and oriented x 3 PSYCHIATRIC:  Normal affect   ASSESSMENT:    1. PAF (paroxysmal atrial fibrillation) (HCC)   2. Primary hypertension   3. Obstructive sleep apnea   4. Obesity (BMI 30-39.9)   5. Other hyperlipidemia    PLAN:    In order of problems listed above:  No recurrence since ablation and successful treatment of obstructive sleep apnea. Excellent blood pressure control on Toprol-XL 50 mg/day. CPAP compliant.  Managed by Dr. 01/04/22. We discussed decreasing calories in diet, avoiding carbohydrates, and increasing exercise. Continue aspirin 81 mg/day.  He is not on statin therapy as his coronary calcium score was essentially 0 in 2020 and his nascent LDL is less than 100 on no therapy.   I recommended that he follow with Dr. 2021 for both general cardiology and sleep management.  He is in agreement.  60-month follow-up estimated.   Medication Adjustments/Labs and Tests Ordered: Current medicines are  reviewed at length with the patient today.  Concerns regarding medicines are outlined above.  No orders of the defined types were placed in this encounter.  No orders of the defined types were placed in this encounter.   Patient Instructions  Medication Instructions:  Your physician recommends that you continue on your current medications as directed. Please refer to the Current Medication list given to you today.  *If you need a refill on your cardiac medications before your next appointment, please call your pharmacy*  Follow-Up: At [facility], you and your health needs are our priority.  As part of our continuing mission to  provide you with exceptional heart care, we have created designated Provider Care Teams.  These Care Teams include your primary Cardiologist (physician) and Advanced Practice Providers (APPs -  Physician Assistants and Nurse Practitioners) who all work together to provide you with the care you need, when you need it.  Your next appointment:   6-9 month(s)  The format for your next appointment:   In Person  Provider:   Armanda Magic, MD  Important Information About Sugar         Signed, Lesleigh Noe, MD  12/01/2022 10:18 AM    [facility]

## 2022-12-01 ENCOUNTER — Encounter: Payer: Self-pay | Admitting: Interventional Cardiology

## 2022-12-01 ENCOUNTER — Ambulatory Visit: Payer: BC Managed Care – PPO | Attending: Interventional Cardiology | Admitting: Interventional Cardiology

## 2022-12-01 VITALS — BP 116/70 | HR 60 | Ht 70.0 in | Wt 231.2 lb

## 2022-12-01 DIAGNOSIS — I480 Paroxysmal atrial fibrillation: Secondary | ICD-10-CM

## 2022-12-01 DIAGNOSIS — E669 Obesity, unspecified: Secondary | ICD-10-CM | POA: Diagnosis not present

## 2022-12-01 DIAGNOSIS — G4733 Obstructive sleep apnea (adult) (pediatric): Secondary | ICD-10-CM

## 2022-12-01 DIAGNOSIS — I10 Essential (primary) hypertension: Secondary | ICD-10-CM

## 2022-12-01 DIAGNOSIS — E7849 Other hyperlipidemia: Secondary | ICD-10-CM

## 2022-12-01 NOTE — Patient Instructions (Signed)
Medication Instructions:  Your physician recommends that you continue on your current medications as directed. Please refer to the Current Medication list given to you today.  *If you need a refill on your cardiac medications before your next appointment, please call your pharmacy*  Follow-Up: At [facility], you and your health needs are our priority.  As part of our continuing mission to provide you with exceptional heart care, we have created designated Provider Care Teams.  These Care Teams include your primary Cardiologist (physician) and Advanced Practice Providers (APPs -  Physician Assistants and Nurse Practitioners) who all work together to provide you with the care you need, when you need it.  Your next appointment:   6-9 month(s)  The format for your next appointment:   In Person  Provider:   Armanda Magic, MD  Important Information About Sugar

## 2023-01-28 ENCOUNTER — Encounter (HOSPITAL_COMMUNITY): Payer: Self-pay | Admitting: *Deleted

## 2023-02-25 ENCOUNTER — Other Ambulatory Visit: Payer: Self-pay | Admitting: *Deleted

## 2023-02-25 MED ORDER — METOPROLOL SUCCINATE ER 50 MG PO TB24: ORAL_TABLET | ORAL | 1 refills | Status: DC

## 2023-05-18 ENCOUNTER — Encounter: Payer: Self-pay | Admitting: Cardiology

## 2023-06-01 ENCOUNTER — Other Ambulatory Visit: Payer: Self-pay | Admitting: Cardiology

## 2023-06-09 ENCOUNTER — Encounter: Payer: Self-pay | Admitting: Cardiology

## 2023-06-18 ENCOUNTER — Ambulatory Visit: Payer: BC Managed Care – PPO | Admitting: Cardiology

## 2023-07-29 NOTE — Progress Notes (Signed)
Date:  07/30/2023   ID:  Miguel Stevens, DOB 10-29-1956, MRN [id number]  PCP:  Shirlean Mylar, MD  Cardiologist:  Lesleigh Noe, MD (Inactive)  Sleep Medicine:  Armanda Magic, MD Electrophysiologist:  None   Chief Complaint:  OSA  History of Present Illness:    Miguel Stevens is a 67 y.o. male  with a hx of PAF, GERD and HTN who was referred by Dr. Katrinka Blazing for sleep evaluation.  He was seen by Dr. Katrinka Blazing and mentioned that he snores but did not have excessive daytime sleepiness.  Due to hx of PAF a sleep study was recommended.  He underwent PSG which showed moderate OSA with an AHI of 16.5/hr and O2 sats as low as 81% with nocturnal hypoxemia.   He is back today because he is not tolerating CPAP therapy and is interested in discussing the inspire device.  He tells me that he feels he should be sleeping more.  He goes to bed at [place] but cannot sleep in because others in his house get up early and wake him out of REM sleep.  He is doing well with his PAP device and thinks that he has gotten used to it.  He tolerates the nasal mask and feels the pressure is adequate.  Since going on PAP he feels rested in the am and has no significant daytime sleepiness.  He denies any significant mouth or nasal dryness but sometimes has  nasal congestion.  He does not think that he snores.  He says that the only issue is that he would like more portability.  He likes to camp and cannot take it with him.  He hates being tied down to a CPAP device and would really like to consider the Inspire.   Prior CV studies:   The following studies were reviewed today:  PAP compliance download  Past Medical History:  Diagnosis Date   Back pain    GERD (gastroesophageal reflux disease)    Hypertension    OSA (obstructive sleep apnea)    not compliant with cpap   Overweight    Paroxysmal atrial fibrillation (HCC)    Lone atrial fibrillation by Holter July 2009. This echo demonstrates normal left ventricle size and function. LA  size 42 mm    Past Surgical History:  Procedure Laterality Date   ATRIAL FIBRILLATION ABLATION N/A 10/08/2020   Procedure: ATRIAL FIBRILLATION ABLATION;  Surgeon: [name], MD;  [facility]: [facility];  Service: Cardiovascular;  Laterality: N/A;   wisdom teeth excised       Current Meds  Medication Sig   aspirin EC 81 MG tablet Take 1 tablet (81 mg total) by mouth daily. Swallow whole.   FAMOTIDINE PO Take 2 tablets by mouth daily with supper.    Homeopathic Products (PROSACEA EX) Apply 1 Application topically daily at 6 (six) AM. Daily treatment for rosacea.   metoprolol succinate (TOPROL-XL) 50 MG 24 hr tablet TAKE 1 TABLET BY MOUTH EVERY DAY WITH OR IMMEDIATELY FOLLOWING A MEAL   Multiple Vitamin (MULTIVITAMIN) tablet Take 1 tablet by mouth daily. Men 50 +     Allergies:   Patient has no known allergies.   Social History   Tobacco Use   Smoking status: Never   Smokeless tobacco: Never   Tobacco comments:    Never smoke 11/04/22  Vaping Use   Vaping status: Never Used  Substance Use Topics   Alcohol use: Yes    Alcohol/week: 0.0 standard drinks  of alcohol    Comment: an occasionally beer .   Drug use: No     Family Hx: The patient's family history includes Heart disease in his father and mother.  ROS:   Please see the history of present illness.     All other systems reviewed and are negative.   Labs/Other Tests and Data Reviewed:    Recent Labs: No results found for requested labs within last 365 days.   Recent Lipid Panel Lab Results  Component Value Date/Time   CHOL 109 09/11/2021 08:59 AM   TRIG 146 09/11/2021 08:59 AM   HDL 34 (L) 09/11/2021 08:59 AM   CHOLHDL 3.2 09/11/2021 08:59 AM   LDLCALC 50 09/11/2021 08:59 AM    Wt Readings from Last 3 Encounters:  07/30/23 230 lb (104.3 kg)  12/01/22 231 lb 3.2 oz (104.9 kg)  11/04/22 231 lb 9.6 oz (105.1 kg)     Objective:    Vital Signs:  BP 116/68   Pulse 60   Ht 5\' 10"  (1.778 m)   Wt  230 lb (104.3 kg)   SpO2 97%   BMI 33.00 kg/m   GEN: Well nourished, well developed in no acute distress HEENT: Normal NECK: No JVD; No carotid bruits LYMPHATICS: No lymphadenopathy CARDIAC:RRR, no murmurs, rubs, gallops RESPIRATORY:  Clear to auscultation without rales, wheezing or rhonchi  ABDOMEN: Soft, non-tender, non-distended MUSCULOSKELETAL:  No edema; No deformity  SKIN: Warm and dry NEUROLOGIC:  Alert and oriented x 3 PSYCHIATRIC:  Normal affect  ASSESSMENT & PLAN:    1.  OSA - The patient is tolerating PAP therapy well without any problems. The PAP download performed by his DME was personally reviewed and interpreted by me today and showed an AHI of 1.1 /hr on auto CPAP 4-20 cm H2O with 97 % compliance in using more than 4 hours nightly.  The patient has been using and benefiting from PAP use and will continue to benefit from therapy.  -he hates being tied down to a CPAP device even though it is working well.  He likes to camp and cannot take it and hates to miss a night with it -We are going to proceed with an in lab NPSG to document degree of OSA to see if he qualifies for [facility] and if he does then refer to Dr. Jenne Pane   2.  HTN -BP controlled on exam today continue -Drug management with Toprol XL 50 mg daily with as needed refills  3.  Paroxysmal atrial fibrillation -Status post ablation 2021 -He has been maintaining normal sinus rhythm and denies palpitations -He is followed in A-fib clinic -Continue prescription drug management with Toprol-XL 50 mg daily -No anticoagulation due to low CHA2DS2-VASc score of 1  Medication Adjustments/Labs and Tests Ordered: Current medicines are reviewed at length with the patient today.  Concerns regarding medicines are outlined above.  Tests Ordered: No orders of the defined types were placed in this encounter.  Medication Changes: No orders of the defined types were placed in this encounter.   Disposition:  Follow up  in 1  year Signed, Armanda Magic, MD  07/30/2023 10:07 AM    [facility]

## 2023-07-30 ENCOUNTER — Encounter: Payer: Self-pay | Admitting: Cardiology

## 2023-07-30 ENCOUNTER — Ambulatory Visit: Payer: BC Managed Care – PPO | Admitting: Cardiology

## 2023-07-30 VITALS — BP 116/68 | HR 60 | Ht 70.0 in | Wt 230.0 lb

## 2023-07-30 DIAGNOSIS — I10 Essential (primary) hypertension: Secondary | ICD-10-CM | POA: Diagnosis not present

## 2023-07-30 DIAGNOSIS — I480 Paroxysmal atrial fibrillation: Secondary | ICD-10-CM

## 2023-07-30 DIAGNOSIS — G4733 Obstructive sleep apnea (adult) (pediatric): Secondary | ICD-10-CM | POA: Diagnosis not present

## 2023-07-30 NOTE — Patient Instructions (Signed)
Medication Instructions:  Your physician recommends that you continue on your current medications as directed. Please refer to the Current Medication list given to you today.  *If you need a refill on your cardiac medications before your next appointment, please call your pharmacy*   Lab Work: None.  If you have labs (blood work) drawn today and your tests are completely normal, you will receive your results only by: MyChart Message (if you have MyChart) OR A paper copy in the mail If you have any lab test that is abnormal or we need to change your treatment, we will call you to review the results.   Testing/Procedures: Your physician has recommended that you have an in-lab sleep study. This test records several body functions during sleep, including: brain activity, eye movement, oxygen and carbon dioxide blood levels, heart rate and rhythm, breathing rate and rhythm, the flow of air through your mouth and nose, snoring, body muscle movements, and chest and belly movement.    Follow-Up: At [facility], you and your health needs are our priority.  As part of our continuing mission to provide you with exceptional heart care, we have created designated Provider Care Teams.  These Care Teams include your primary Cardiologist (physician) and Advanced Practice Providers (APPs -  Physician Assistants and Nurse Practitioners) who all work together to provide you with the care you need, when you need it.  We recommend signing up for the patient portal called "MyChart".  Sign up information is provided on this After Visit Summary.  MyChart is used to connect with patients for Virtual Visits (Telemedicine).  Patients are able to view lab/test results, encounter notes, upcoming appointments, etc.  Non-urgent messages can be sent to your provider as well.   To learn more about what you can do with MyChart, go to [email or web address].    Your next appointment:   1 year(s)  Provider:    Dr. Armanda Magic, MD

## 2023-07-30 NOTE — Addendum Note (Signed)
Addended by: Luellen Pucker on: 07/30/2023 10:18 AM   Modules accepted: Orders

## 2023-08-16 ENCOUNTER — Telehealth: Payer: Self-pay | Admitting: *Deleted

## 2023-08-16 NOTE — Telephone Encounter (Signed)
Prior Authorization for PSG sent to [facility] via web portal. Tracking Number . READY- do not require Pre-Authorization by [employer]

## 2023-08-28 ENCOUNTER — Other Ambulatory Visit: Payer: Self-pay | Admitting: Cardiology

## 2023-09-12 ENCOUNTER — Ambulatory Visit (HOSPITAL_BASED_OUTPATIENT_CLINIC_OR_DEPARTMENT_OTHER): Payer: BC Managed Care – PPO | Attending: Cardiology | Admitting: Cardiology

## 2023-09-12 DIAGNOSIS — G4733 Obstructive sleep apnea (adult) (pediatric): Secondary | ICD-10-CM | POA: Diagnosis not present

## 2023-09-14 NOTE — Procedures (Signed)
Patient Name: Miguel Stevens, Miguel Stevens Date: 09/12/2023 Gender: Male D.O.B: [birth year] Age (years): 60 Referring Provider: Armanda Magic MD, ABSM Height (inches): 70 Interpreting Physician: Armanda Magic MD, ABSM Weight (lbs): 220 RPSGT: Cherylann Parr BMI: 32 MRN: [id number] Neck Size: 19.00  CLINICAL INFORMATION Sleep Study Type: NPSG  Indication for sleep study: Snoring, Witnesses Apnea / Gasping During Sleep  Epworth Sleepiness Score: 4  Most recent polysomnogram was on 01/07/2020. Most recent titration study was on 09/12/2023.  SLEEP STUDY TECHNIQUE As per the AASM Manual for the Scoring of Sleep and Associated Events v2.3 (April 2016) with a hypopnea requiring 4% desaturations.  The channels recorded and monitored were frontal, central and occipital EEG, electrooculogram (EOG), submentalis EMG (chin), nasal and oral airflow, thoracic and abdominal wall motion, anterior tibialis EMG, snore microphone, electrocardiogram, and pulse oximetry.  MEDICATIONS Medications self-administered by patient taken the night of the study : N/A  SLEEP ARCHITECTURE The study was initiated at 9:43:38 PM and ended at 4:04:33 AM.  Sleep onset time was 21.9 minutes and the sleep efficiency was 89.1%. The total sleep time was 339.5 minutes.  Stage REM latency was 95.0 minutes.  The patient spent 3.8% of the night in stage N1 sleep, 83.5% in stage N2 sleep, 0.0% in stage N3 and 12.7% in REM.  Alpha intrusion was absent.  Supine sleep was 59.64%.  RESPIRATORY PARAMETERS The overall apnea/hypopnea index (AHI) was 42.4 per hour. There were 218 total apneas, including 215 obstructive, 1 central and 2 mixed apneas. There were 22 hypopneas and 13 RERAs.  The AHI during Stage REM sleep was 41.9 per hour.  AHI while supine was 49.8 per hour.  The mean oxygen saturation was 95.1%. The minimum SpO2 during sleep was 77.0%.  moderate snoring was noted during this study.  CARDIAC DATA The 2 lead EKG  uninterpretable. The mean heart rate was 99.2 beats per minute.   LEG MOVEMENT DATA The total PLMS were 0 with a resulting PLMS index of 0.0. Associated arousal with leg movement index was 0.0 .  IMPRESSIONS - Severe obstructive sleep apnea occurred during this study (AHI = 42.4/h). - No significant central sleep apnea occurred during this study (CAI = 0.2/h). - Moderate oxygen desaturation was noted during this study (Min O2 = 77.0%). - The patient snored with moderate snoring volume. - EKG uninterpretable. - Clinically significant periodic limb movements did not occur during sleep. No significant associated arousals.  DIAGNOSIS - Obstructive Sleep Apnea (G47.33) - Nocturnal Hypoxemia (G47.36)  RECOMMENDATIONS - Therapeutic CPAP titration to determine optimal pressure required to alleviate sleep disordered breathing. - Positional therapy avoiding supine position during sleep. - Avoid alcohol, sedatives and other CNS depressants that may worsen sleep apnea and disrupt normal sleep architecture. - Sleep hygiene should be reviewed to assess factors that may improve sleep quality. - Weight management and regular exercise should be initiated or continued if appropriate.  [Electronically signed] 09/14/2023 06:58 PM  Armanda Magic MD, ABSM Diplomate, American Board of Sleep Medicine

## 2023-09-21 ENCOUNTER — Telehealth: Payer: Self-pay | Admitting: *Deleted

## 2023-09-21 NOTE — Telephone Encounter (Addendum)
DISREGARD MESSAGE TITRATION;  Miguel Reichert, MD  P Cv Div Sleep Studies Please disregard order for CPAP titration.  This study was to [facility] for the [facility] device which he does meet criteria for.  Please refer to Dr. Jenne Pane to be evaluated for the [facility]  Referral sent to the sleep nurse Alcario Drought to send to Dr. Jenne Pane.

## 2023-09-21 NOTE — Telephone Encounter (Signed)
-----   Message from Armanda Magic sent at 09/14/2023  6:59 PM EDT ----- Please let patient know that they have sleep apnea.  Recommend therapeutic CPAP titration for treatment of patient's sleep disordered breathing.  If unable to perform an in lab titration then initiate ResMed auto CPAP from 4 to 15cm H2O with heated humidity and mask of choice and overnight pulse ox on CPAP.

## 2023-09-28 NOTE — Telephone Encounter (Signed)
Call to patient, answered all questions about the process to qualify for [facility] Device. Patient requesting Mychart visit to discuss further with Dr. Mayford Knife, booked for 11/01/23 at virtual sleep clinic.

## 2023-11-01 ENCOUNTER — Ambulatory Visit: Payer: BC Managed Care – PPO | Attending: Cardiology | Admitting: Cardiology

## 2023-11-01 DIAGNOSIS — I10 Essential (primary) hypertension: Secondary | ICD-10-CM

## 2023-11-01 DIAGNOSIS — G4733 Obstructive sleep apnea (adult) (pediatric): Secondary | ICD-10-CM

## 2023-11-01 NOTE — Progress Notes (Addendum)
SLEEP MEDICINE VIRTUAL VISIT  via Video Note   Because of Miguel Stevens's co-morbid illnesses, he is at least at moderate risk for complications without adequate follow up.  This format is felt to be most appropriate for this patient at this time.  All issues noted in this document were discussed and addressed.  A limited physical exam was performed with this format.  Please refer to the patient's chart for his consent to telehealth for [facility].       Stevens:  11/13/2023   ID:  Miguel Stevens, DOB [birth year], MRN [id number] The patient was identified using 2 identifiers.  Patient Location: Home Provider Location: Home Office  Stevens:  11/01/2023   ID:  Miguel Stevens, DOB 06-May-1956, MRN [id number]  PCP:  Miguel Mylar, MD  Cardiologist:  Miguel Noe, MD (Inactive)  Sleep Medicine:  Miguel Magic, MD Electrophysiologist:  None   Chief Complaint:  OSA  History of Present Illness:    Miguel Stevens is a 67 y.o. male  with a hx of PAF, GERD and HTN who was referred by Miguel Stevens for sleep evaluation.  He was seen by Miguel Stevens and mentioned that he snores but did not have excessive daytime sleepiness.  Due to hx of PAF a sleep study was recommended.  He underwent PSG which showed moderate OSA with an AHI of 16.5/hr and O2 sats as low as 81% with nocturnal hypoxemia.   At last OV he was stating that he felt tied down to his CPAP and got into his way when wanting to travel.  We decided to order a new sleep study to see if he was a candidate for the [facility] device.  He underwent CPAP titration showing severe OSA with an AHI of 42.hr.  He is now back to followup to see if he qualifies for the [facility] device.   Prior CV studies:   The following studies were reviewed today:  PAP compliance download  Past Medical History:  Diagnosis Stevens   Back pain    GERD (gastroesophageal reflux disease)    Hypertension    OSA (obstructive sleep apnea)    not compliant with cpap    Overweight    Paroxysmal atrial fibrillation (HCC)    Lone atrial fibrillation by Holter July 2009. This echo demonstrates normal left ventricle size and function. LA size 42 mm    Past Surgical History:  Procedure Laterality Stevens   ATRIAL FIBRILLATION ABLATION N/A 10/08/2020   Procedure: ATRIAL FIBRILLATION ABLATION;  Surgeon: Miguel Range, MD;  Location: [facility] LAB;  Service: Cardiovascular;  Laterality: N/A;   wisdom teeth excised       Current Meds  Medication Sig   aspirin EC 81 MG tablet Take 1 tablet (81 mg total) by mouth daily. Swallow whole.   FAMOTIDINE PO Take 2 tablets by mouth daily with supper.    Homeopathic Products (PROSACEA EX) Apply 1 Application topically daily at 6 (six) AM. Daily treatment for rosacea.   metoprolol succinate (TOPROL-XL) 50 MG 24 hr tablet TAKE 1 TABLET BY MOUTH EVERY DAY WITH OR IMMEDIATELY FOLLOWING A MEAL   Multiple Vitamin (MULTIVITAMIN) tablet Take 1 tablet by mouth daily. Men 50 +     Allergies:   Patient has no known allergies.   Social History   Tobacco Use   Smoking status: Never   Smokeless tobacco: Never   Tobacco comments:    Never smoke  11/04/22  Vaping Use   Vaping status: Never Used  Substance Use Topics   Alcohol use: Yes    Alcohol/week: 0.0 standard drinks of alcohol    Comment: an occasionally beer .   Drug use: No     Family Hx: The patient's family history includes Heart disease in his father and mother.  ROS:   Please see the history of present illness.     All other systems reviewed and are negative.   Labs/Other Tests and Data Reviewed:    Recent Labs: No results found for requested labs within last 365 days.   Recent Lipid Panel Lab Results  Component Value Stevens/Time   CHOL 109 09/11/2021 08:59 AM   TRIG 146 09/11/2021 08:59 AM   HDL 34 (L) 09/11/2021 08:59 AM   CHOLHDL 3.2 09/11/2021 08:59 AM   LDLCALC 50 09/11/2021 08:59 AM    Wt Readings from Last 3 Encounters:  09/12/23 220  lb (99.8 kg)  07/30/23 230 lb (104.3 kg)  12/01/22 231 lb 3.2 oz (104.9 kg)     Objective:    Vital Signs:  There were no vitals taken for this visit.  Well nourished, well developed male in no acute distress. Well appearing, alert and conversant, regular work of breathing,  good skin color  Eyes- anicteric mouth- oral mucosa is pink  neuro- grossly intact skin- no apparent rash or lesions or cyanosis EXTR anyway he can come back in the afternoon I will not be home until 1030 ASSESSMENT & PLAN:    1.  OSA  - At last OV he requested to be considered for the [facility] device because he did not like being tied down with a PAP device - we repeated a sleep study which showed severe OSA with an AHI 42/hr - he was referred to Dr. Jenne Stevens but decided he needed more questions answered first - I discussed the Inspire device and the workup for it and the pros and cons of the procedure at length and all questions anwered - I will refer him to Dr. Jenne Stevens for further evaluation  2.  HTN -BP is controlled on exam today -Continue drug management with Toprol-XL 50 mg daily with as needed refills   Total time of encounter: 20 minutes total time of encounter, including 15 minutes spent in face-to-face patient care on the Stevens of this encounter. This time includes coordination of care and counseling regarding above mentioned problem list. Remainder of non-face-to-face time involved reviewing chart documents/testing relevant to the patient encounter and documentation in the medical record. I have independently reviewed documentation from referring provider.    Medication Adjustments/Labs and Tests Ordered: Current medicines are reviewed at length with the patient today.  Concerns regarding medicines are outlined above.   Tests Ordered: Orders Placed This Encounter  Procedures   Ambulatory referral to ENT    Medication Changes: No orders of the defined types were placed in this encounter.   Follow  Up:  In Person after getting INspire  Signed, Miguel Magic, MD  11/13/2023 3:42 PM    [facility]   Medication Adjustments/Labs and Tests Ordered: Current medicines are reviewed at length with the patient today.  Concerns regarding medicines are outlined above.  Tests Ordered: No orders of the defined types were placed in this encounter.  Medication Changes: No orders of the defined types were placed in this encounter.   Disposition:  Follow up  in 1 year Signed, Miguel Magic, MD  11/01/2023 8:43  AM    [facility]

## 2023-11-01 NOTE — Addendum Note (Signed)
Addended by: Luellen Pucker on: 11/01/2023 09:14 AM   Modules accepted: Orders

## 2023-11-01 NOTE — Patient Instructions (Addendum)
Medication Instructions:  Your physician recommends that you continue on your current medications as directed. Please refer to the Current Medication list given to you today.  *If you need a refill on your cardiac medications before your next appointment, please call your pharmacy*   Lab Work: None.  If you have labs (blood work) drawn today and your tests are completely normal, you will receive your results only by: MyChart Message (if you have MyChart) OR A paper copy in the mail If you have any lab test that is abnormal or we need to change your treatment, we will call you to review the results.   Testing/Procedures: None.   Follow-Up:  Your next appointment will be after you have been assessed for the Inspire Device by Dr. Christia Reading and it will be with:     Provider:   Dr. Armanda Magic, MD   Other Instructions Dr. Mayford Knife has referred you to Dr. Christia Reading, ENT for evaluation for the [facility] Device. Someone from his office will call you to set up an appointment. [address], [place], [place] [address] Phone: [phone]

## 2024-06-20 ENCOUNTER — Encounter: Payer: Self-pay | Admitting: Cardiology

## 2024-07-07 ENCOUNTER — Telehealth: Payer: Self-pay

## 2024-07-07 DIAGNOSIS — G4733 Obstructive sleep apnea (adult) (pediatric): Secondary | ICD-10-CM

## 2024-07-07 DIAGNOSIS — I480 Paroxysmal atrial fibrillation: Secondary | ICD-10-CM

## 2024-07-07 DIAGNOSIS — I10 Essential (primary) hypertension: Secondary | ICD-10-CM

## 2024-07-07 DIAGNOSIS — E7849 Other hyperlipidemia: Secondary | ICD-10-CM

## 2024-07-07 NOTE — Telephone Encounter (Signed)
 Patient came into office and picked up sample Airfit N20 Mask. Order for travel CPAP device and supplies sent to [facility] today.

## 2024-07-07 NOTE — Telephone Encounter (Signed)
-----   Message from Miguel Stevens sent at 06/29/2024 10:29 PM EDT ----- Decrease CPAP to auto from 4-12cm H2O due to good AHI.  Repeat download in 4 weeks ----- Message ----- From: Bevely Connell BROCKS, LPN Sent: 2/89/7974   1:34 PM EDT To: Miguel JONELLE Bihari, MD  Most recent download: Lory, Galan 05/30/2024 - 06/28/2024 DOB: Jun 04, 1956 Age: 68 years [facility] [place] [address] [place]  , [address] Phone: [phone] Email: [email or web address] Compliance Report Compliance Payor Standard Usage 05/30/2024 - 06/28/2024 Usage days 30/30 days (100%) >= 4 hours 26 days (87%) < 4 hours 4 days (13%) Usage hours 161 hours 17 minutes Average usage (total days) 5 hours 23 minutes Average usage (days used) 5 hours 23 minutes Median usage (days used) 5 hours 51 minutes Total used hours (value since last reset - 06/28/2024) 7,865 hours AirSense 10 AutoSet Serial number [id number] Mode AutoSet Min Pressure 4 cmH2O Max Pressure 20 cmH2O EPR Fulltime EPR level 3 Response Soft Therapy Pressure - cmH2O Median: 7.7 95th percentile: 10.0 Maximum: 11.1 Leaks - L/min Median: 1.1 95th percentile: 14.0 Maximum: 28.9 Events per hour AI: 0.4 HI: 0.5 AHI: 0.9 Apnea Index Central: 0.1 Obstructive: 0.3 Unknown: 0.1 RERA Index 0.2 Cheyne-Stokes respiration (average duration per night) 0 minutes (0%) Usage - hours Printed on 06/29/2024 - ResMed AirView version 4.49.0-5.0 Page 1 of 1 Esmond, Hinch 05/30/2024 - 06/28/2024 DOB: Jan 16, 1956 Age: 44 years [facility] -  [address] [place] , [address] Phone: [phone] Email: [email or web address] Therapy Report AirSense 10 AutoSet SN: [id number] Usage (hours) Usage days 30/30 (100%) >= 4 hour days 26 (87%) < 4 hour days 4 (13%) Days not used 0 (0%) Days no data 0 (0%) Used/day (avg.) 5.4 hrs. Leak (L/min) Set threshold 24.0 L/min Maximum (avg) 28.9 95th % (avg)  14.0 Median (avg) 1.1 Pressure (cmH2O) Mode AutoSet Set EPR Fulltime, 3.0 Set Max Pressure 20.0 Set Min Pressure 4.0 Maximum (avg) 11.1 95th % (avg) 10.0 Median (avg) 7.7 AHI (events/hour) AHI 0.9 HI 0.5 AI 0.4 CAI 0.1 OAI 0.3 UAI 0.0 RERA 0.2 CSR% (avg) 0.0 Printed on 06/29/2024 - ResMed AirView version 4.49.0-5.0 Page 1 of 1

## 2024-07-07 NOTE — Telephone Encounter (Signed)
 Order for travel device and supplies has been sent to [facility] today. Patient will come by office to pick up sample nasal mask today.

## 2024-08-31 ENCOUNTER — Other Ambulatory Visit: Payer: Self-pay | Admitting: Cardiology

## 2024-11-29 ENCOUNTER — Telehealth: Payer: Self-pay | Admitting: Cardiology

## 2024-11-29 ENCOUNTER — Other Ambulatory Visit: Payer: Self-pay | Admitting: Cardiology

## 2024-11-29 MED ORDER — METOPROLOL SUCCINATE ER 50 MG PO TB24: ORAL_TABLET | ORAL | 0 refills | Status: DC

## 2024-11-29 NOTE — Telephone Encounter (Signed)
 Pt scheduled to see Katlyn West, NP, 01/18/25.  Refill sent.

## 2024-11-29 NOTE — Telephone Encounter (Signed)
°*  STAT* If patient is at the pharmacy, call can be transferred to refill team.   1. Which medications need to be refilled? (please list name of each medication and dose if known)   metoprolol  succinate (TOPROL -XL) 50 MG 24 hr tablet    2. Which pharmacy/location (including street and city if local pharmacy) is medication to be sent to?  [id number] - [place], [place] - [address]    3. Do they need a 30 day or 90 day supply? 90   Patient has appt 01/18/25

## 2025-01-15 NOTE — Progress Notes (Unsigned)
 "  Cardiology Office Note    Date:  01/18/2025  ID:  Miguel Stevens, DOB 09-12-1956, MRN [id number] PCP:  Douglass Ivanoff, MD  Cardiologist:  Victory LELON Claudene DOUGLAS, MD (Inactive)  Electrophysiologist:  None   Chief Complaint: Follow up for PAF  History of Present Illness: .   Miguel Stevens is a 69 y.o. male with visit-pertinent history of paroxysmal atrial fibrillation s/p ablation by Dr. Kelsie in 2022, hypertension, hyperlipidemia, GERD.  Patient with history of atrial fibrillation underwent successful ablation in 2022.  Anticoagulation discontinued and aspirin  substituted given low CHA2DS2-VASc and no recurrence of atrial fibrillation following his ablation.  Patient was previously followed also by Dr. Shlomo for CPAP.  Today he presents for follow-up.  He reports that he has been doing very well overall.  He denies any chest pain, shortness of breath, lower extremity edema, diarrhea or PND.  He denies any palpitations, presyncope or syncope.  He does note on occasion when he stands too fast he will feel slightly lightheaded, resolves within seconds.  Patient continues to use his CPAP nightly, has bought a travel CPAP to take with him when camping.  Patient denies any cardiac concerns or complaints today.  Patient reports that he has not been intentionally exercising, notes he is somewhat active with his children. ROS: .   Today he denies chest pain, shortness of breath, lower extremity edema, fatigue, palpitations, melena, hematuria, hemoptysis, diaphoresis, weakness, presyncope, syncope, orthopnea, and PND.  All other systems are reviewed and otherwise negative. Studies Reviewed: SABRA   EKG:  EKG is ordered today, personally reviewed, demonstrating  EKG Interpretation Date/Time:  Thursday January 18 2025 13:24:16 EST Ventricular Rate:  63 PR Interval:  200 QRS Duration:  106 QT Interval:  458 QTC Calculation: 468 R Axis:   -25  Text Interpretation: Normal sinus rhythm When compared with ECG of  04-Nov-2022 11:07, QRS axis Shifted left Confirmed by Chyrel Taha [phone]) on 01/18/2025 1:47:38 PM   CV Studies: Cardiac studies reviewed are outlined and summarized above. Otherwise please see EMR for full report. Cardiac Studies & Procedures   ______________________________________________________________________________________________     ECHOCARDIOGRAM  ECHOCARDIOGRAM COMPLETE 12/07/2019  Narrative ECHOCARDIOGRAM REPORT    Patient Name:   Miguel Stevens Date of Exam: 12/07/2019 Medical Rec #:  [id number]     Height:       70.0 in Accession #:    [id number]    Weight:       218.0 lb Date of Birth:  1956/10/20    BSA:          2.17 m Patient Age:    62 years      BP:           163/98 mmHg Patient Gender: M             HR:           94 bpm. Exam Location:  [address]  Procedure: 2D Echo, Cardiac Doppler and Color Doppler  Indications:    I48.91 Atrial Fibrillation  History:        Patient has no prior history of Echocardiogram examinations. Risk Factors:Hypertension and HLD.  Sonographer:    Waldo Guadalajara RCS Referring Phys: [id number] VICTORY LELON [facility]  IMPRESSIONS   1. Left ventricular ejection fraction, by visual estimation, is 60 to 65%. The left ventricle has normal function. There is mildly increased left ventricular hypertrophy. 2. Left ventricular diastolic function could not be evaluated. 3. The left ventricle has  no regional wall motion abnormalities. 4. Global right ventricle has normal systolic function.The right ventricular size is normal. No increase in right ventricular wall thickness. 5. Left atrial size was mildly dilated. 6. Right atrial size was normal. 7. The mitral valve is normal in structure. Trivial mitral valve regurgitation. No evidence of mitral stenosis. 8. The tricuspid valve is normal in structure. Tricuspid valve regurgitation is trivial. 9. The aortic valve is normal in structure. Aortic valve regurgitation is not visualized. No evidence of aortic  valve sclerosis or stenosis. 10. The pulmonic valve was normal in structure. Pulmonic valve regurgitation is not visualized. 11. The inferior vena cava is normal in size with greater than 50% respiratory variability, suggesting right atrial pressure of 3 mmHg.  FINDINGS Left Ventricle: Left ventricular ejection fraction, by visual estimation, is 60 to 65%. The left ventricle has normal function. The left ventricle has no regional wall motion abnormalities. There is mildly increased left ventricular hypertrophy. Concentric left ventricular hypertrophy. The left ventricular diastology could not be evaluated due to atrial fibrillation. Left ventricular diastolic function could not be evaluated. Normal left atrial pressure.  Right Ventricle: The right ventricular size is normal. No increase in right ventricular wall thickness. Global RV systolic function is has normal systolic function.  Left Atrium: Left atrial size was mildly dilated.  Right Atrium: Right atrial size was normal in size  Pericardium: There is no evidence of pericardial effusion.  Mitral Valve: The mitral valve is normal in structure. Trivial mitral valve regurgitation. No evidence of mitral valve stenosis by observation.  Tricuspid Valve: The tricuspid valve is normal in structure. Tricuspid valve regurgitation is trivial.  Aortic Valve: The aortic valve is normal in structure. Aortic valve regurgitation is not visualized. The aortic valve is structurally normal, with no evidence of sclerosis or stenosis.  Pulmonic Valve: The pulmonic valve was normal in structure. Pulmonic valve regurgitation is not visualized. Pulmonic regurgitation is not visualized.  Aorta: The aortic root, ascending aorta and aortic arch are all structurally normal, with no evidence of dilitation or obstruction.  Venous: The inferior vena cava is normal in size with greater than 50% respiratory variability, suggesting right atrial pressure of 3  mmHg.  IAS/Shunts: No atrial level shunt detected by color flow Doppler. There is no evidence of a patent foramen ovale. No ventricular septal defect is seen or detected. There is no evidence of an atrial septal defect.   LEFT VENTRICLE PLAX 2D LVIDd:         4.50 cm  Diastology LVIDs:         2.65 cm  LV e' lateral:   16.60 cm/s LV PW:         1.26 cm  LV E/e' lateral: 5.1 LV IVS:        1.14 cm  LV e' medial:    8.38 cm/s LVOT diam:     2.40 cm  LV E/e' medial:  10.0 LV SV:         67 ml LV SV Index:   29.78 LVOT Area:     4.52 cm   RIGHT VENTRICLE RV Basal diam:  3.80 cm RV S prime:     14.50 cm/s TAPSE (M-mode): 1.3 cm  LEFT ATRIUM             Index       RIGHT ATRIUM           Index LA diam:        4.10 cm 1.89 cm/m  RA Area:     15.30 cm LA Vol (A2C):   68.6 ml 31.68 ml/m RA Volume:   38.80 ml  17.92 ml/m LA Vol (A4C):   46.1 ml 21.29 ml/m LA Biplane Vol: 56.7 ml 26.19 ml/m AORTIC VALVE LVOT Vmax:   78.57 cm/s LVOT Vmean:  51.367 cm/s LVOT VTI:    0.124 m  AORTA Ao Root diam: 3.30 cm  MITRAL VALVE MV Area (PHT):                     SHUNTS MV PHT:                            Systemic VTI:  0.12 m MV Decel Time: 166 msec            Systemic Diam: 2.40 cm MV E velocity: 83.90 cm/s 103 cm/s   Leim Moose MD Electronically signed by Leim Moose MD Signature Date/Time: 12/07/2019/6:52:14 PM    Final    MONITORS  LONG TERM MONITOR (3-14 DAYS) 04/29/2020  Narrative  Paroxysmal atrial fibrillation alternating with sinus rhythm and sinus bradycardia  Atrial fibrillation burden 67%  Rate range 47 to 188 bpm  Paroxysmal atrial fibrillation with poor ventricular response with rates up to 188 bpm.  A. fib burden 67%   CT SCANS  CT CARDIAC SCORING (SELF PAY ONLY) 12/07/2019  Addendum 12/07/2019  3:21 PM ADDENDUM REPORT: 12/07/2019 15:18  CLINICAL DATA:  Risk stratification  EXAM: Coronary Calcium  Score  TECHNIQUE: The patient was  scanned on a [employer] scanner. Axial non-contrast 3 mm slices were carried out through the heart. The data set was analyzed on a dedicated work station and scored using the Agatson method.  FINDINGS: Non-cardiac: See separate report from [facility].  Ascending Aorta: Normal size, no calcifications.  Pericardium: Normal.  Coronary arteries: Normal origin.  IMPRESSION: Coronary calcium  score of 1. This was 31 percentile for age and sex matched control.   Electronically Signed By: Leim Moose On: 12/07/2019 15:18  Narrative EXAM: OVER-READ INTERPRETATION  CT CHEST  The following report is an over-read performed by radiologist Dr. Rockey Kilts of [facility] on 12/07/2019. This over-read does not include interpretation of cardiac or coronary anatomy or pathology. The calcium  score interpretation by the cardiologist is attached.  COMPARISON:  None.  FINDINGS: Vascular: Normal aortic caliber.  Mediastinum/Nodes: No imaged thoracic adenopathy.  Lungs/Pleura: No imaged pleural fluid. Bilateral pulmonary nodules, including within the right upper and left lower lobes on image 13/3. Example at 4 mm in the left lower lobe. There is also a 2 mm left lower lobe nodule.  Upper Abdomen: Moderate hepatic steatosis.  Musculoskeletal: No acute osseous abnormality.  IMPRESSION: 1.  No acute findings in the imaged extracardiac chest. 2. Bilateral pulmonary nodules of maximally 4 mm. No follow-up needed if patient is low-risk. Non-contrast chest CT can be considered in 12 months if patient is high-risk. This recommendation follows the consensus statement: Guidelines for Management of Incidental Pulmonary Nodules Detected on CT Images: From the Fleischner Society 2017; Radiology 2017; 284:228-243. 3. Hepatic steatosis.  Electronically Signed: By: Rockey Kilts M.D. On: 12/07/2019 14:08      ______________________________________________________________________________________________       Current Reported Medications:.    Active Medications[1]  Physical Exam:    VS:  BP 132/82   Pulse 63   Ht 5' 10 (1.778 m)   Wt 235 lb 9.6 oz (106.9  kg)   SpO2 98%   BMI 33.81 kg/m    Wt Readings from Last 3 Encounters:  01/18/25 235 lb 9.6 oz (106.9 kg)  09/12/23 220 lb (99.8 kg)  07/30/23 230 lb (104.3 kg)    GEN: Well nourished, well developed in no acute distress NECK: No JVD; No carotid bruits CARDIAC: RRR, no murmurs, rubs, gallops RESPIRATORY:  Clear to auscultation without rales, wheezing or rhonchi  ABDOMEN: Soft, non-tender, non-distended EXTREMITIES:  No edema; No acute deformity     Asessement and Plan:.    PAF: s/p ablation in 2022. EKG indicates normal sinus rhythm. He denies palpitations or feeling or irregular heart beats. No longer on Eliquis given no re-occurrence of atrial fibrillation. Continue metoprolol  succinate 50 mg daily.   HTN: Blood pressure today 132/82. Continue metoprolol  succinate 50 mg daily. Heart healthy diet and regular cardiovascular exercise encouraged.    OSA: Reports CPAP adherence, works very well for him. Has purchased a travel CPAP that he uses when he goes camping.  Continued adherence encouraged.   Disposition: F/u with Dr. Shlomo in one year (sleep medicine).   Signed, Myli Pae D Monifa Blanchette, NP       [1]  Current Meds  Medication Sig   aspirin  EC 81 MG tablet Take 1 tablet (81 mg total) by mouth daily. Swallow whole.   FAMOTIDINE PO Take 2 tablets by mouth daily with supper.    Homeopathic Products (PROSACEA EX) Apply 1 Application topically as needed (rosacea). Daily treatment for rosacea.   Multiple Vitamin (MULTIVITAMIN) tablet Take 1 tablet by mouth daily. Men 50 +   [DISCONTINUED] metoprolol  succinate (TOPROL -XL) 50 MG 24 hr tablet TAKE 1 TABLET BY MOUTH EVERY DAY WITH OR IMMEDIATELY FOLLOWING A MEAL   "

## 2025-01-18 ENCOUNTER — Encounter: Payer: Self-pay | Admitting: Cardiology

## 2025-01-18 ENCOUNTER — Ambulatory Visit: Attending: Cardiology | Admitting: Cardiology

## 2025-01-18 VITALS — BP 132/82 | HR 63 | Ht 70.0 in | Wt 235.6 lb

## 2025-01-18 DIAGNOSIS — G4733 Obstructive sleep apnea (adult) (pediatric): Secondary | ICD-10-CM | POA: Insufficient documentation

## 2025-01-18 DIAGNOSIS — I480 Paroxysmal atrial fibrillation: Secondary | ICD-10-CM | POA: Insufficient documentation

## 2025-01-18 DIAGNOSIS — I10 Essential (primary) hypertension: Secondary | ICD-10-CM | POA: Diagnosis present

## 2025-01-18 DIAGNOSIS — E7849 Other hyperlipidemia: Secondary | ICD-10-CM

## 2025-01-18 MED ORDER — METOPROLOL SUCCINATE ER 50 MG PO TB24: ORAL_TABLET | ORAL | 3 refills | Status: AC

## 2025-01-18 NOTE — Patient Instructions (Signed)
 Medication Instructions:  Your physician recommends that you continue on your current medications as directed. Please refer to the Current Medication list given to you today.  *If you need a refill on your cardiac medications before your next appointment, please call your pharmacy*  Lab Work: NONE If you have labs (blood work) drawn today and your tests are completely normal, you will receive your results only by: MyChart Message (if you have MyChart) OR A paper copy in the mail If you have any lab test that is abnormal or we need to change your treatment, we will call you to review the results.  Testing/Procedures: NONE  Follow-Up: At [facility], you and your health needs are our priority.  As part of our continuing mission to provide you with exceptional heart care, our providers are all part of one team.  This team includes your primary Cardiologist (physician) and Advanced Practice Providers or APPs (Physician Assistants and Nurse Practitioners) who all work together to provide you with the care you need, when you need it.  Your next appointment:   1 year(s)  Provider:   Dr. Shlomo (for SLEEP)
